# Patient Record
Sex: Female | Born: 1990 | Race: Asian | Hispanic: No | Marital: Married | Smoking: Never smoker
Health system: Southern US, Community
[De-identification: ages and names within clinical notes are randomized; demographics above are authoritative.]

## PROBLEM LIST (undated history)

## (undated) HISTORY — PX: TONSILLECTOMY: SUR1361

---

## 2015-01-09 DIAGNOSIS — L700 Acne vulgaris: Secondary | ICD-10-CM | POA: Insufficient documentation

## 2015-04-04 ENCOUNTER — Ambulatory Visit
Admission: RE | Admit: 2015-04-04 | Discharge: 2015-04-04 | Disposition: A | Discharge status: Home / Self Care | Payer: Medicaid Other | Source: Ambulatory Visit | Attending: Physical Medicine and Rehabilitation | Admitting: Physical Medicine and Rehabilitation

## 2015-04-04 ENCOUNTER — Other Ambulatory Visit: Payer: Self-pay | Admitting: Physical Medicine and Rehabilitation

## 2015-04-04 DIAGNOSIS — M545 Low back pain, unspecified: Secondary | ICD-10-CM

## 2016-04-15 IMAGING — CR DG PELVIS 1-2V
1 series · 1 of 1 positions shown · non-contrast
Comparison: None.

CLINICAL DATA: Chronic bilateral low back pain without sciatica

EXAM:
PELVIS - 1-2 VIEW

[w pelvis upright]
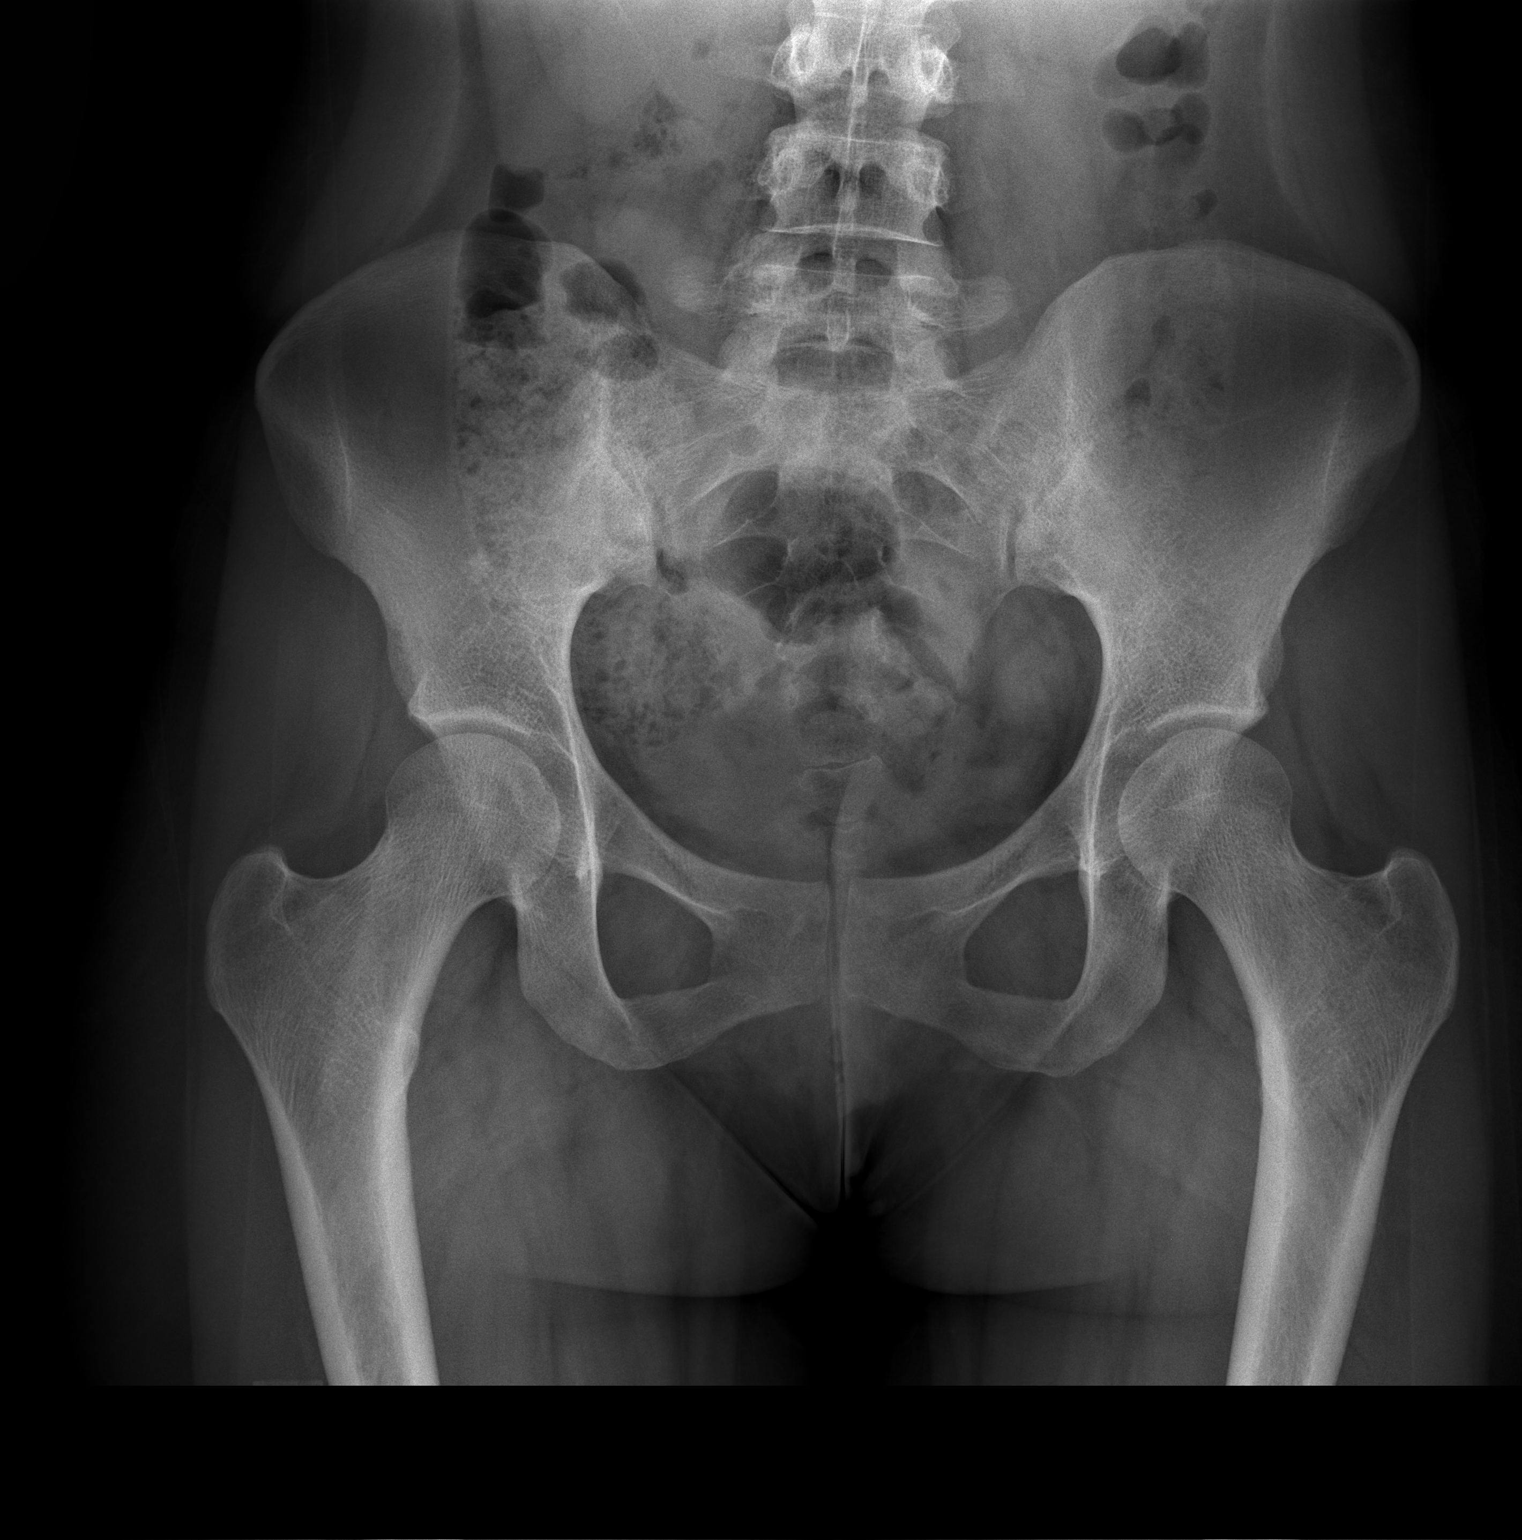

[1 of 1 positions shown; findings below may reference images not displayed]

FINDINGS: There is no evidence of pelvic fracture or diastasis. No pelvic bone
lesions are seen. SI joints are normal.
IMPRESSION: Negative.

## 2017-08-05 NOTE — L&D Delivery Note (Addendum)
Patient is 27 y.o. [id number] [redacted]w[redacted]d admitted for SOL   Delivery Note At 11:47 PM a viable female was delivered via Vaginal, Spontaneous (Presentation: cephalic; ROA ).  APGAR: 9, 9; weight  .   Placenta status: spontaneous, intact.  Cord:  3VC   Anesthesia: none Episiotomy: None Lacerations: 2nd degree;1st degree;Labial Suture Repair: 3.0 vicryl rapide Est. Blood Loss (mL):    Mom to postpartum.  Baby to Couplet care / Skin to Skin.  Upon arrival patient was complete and pushing. She pushed with good maternal effort to deliver a healthy baby boy. Baby delivered without difficulty, was noted to have good tone and place on maternal abdomen for oral suctioning, drying and stimulation, baby demonstrated good ROM of upper extremities bilaterally. Delayed cord clamping performed. Placenta delivered intact with 3V cord. Vaginal canal and perineum was inspected and found to have a second degree perineal laceration and bilateral periurethral lacerations.  The perineal laceration was repaired with 3-0 vicryl rapide while the peri urethral lacerations were hemostatic and not repaired. Pitocin was started and uterus massaged until bleeding slowed. Counts of sharps, instruments, and lap pads were all correct.   Mirian Mo, MD PGY-1 9/26/201912:35 AM  I confirm that I have verified the information documented in the resident's note and that I have also personally reperformed the physical exam and all medical decision making activities.  I was gloved and present for entire delivery SVD without incident No difficulty with shoulders Lacerations as listed above Repair of same supervised by me Aviva Signs, CNM  Please schedule this patient for Postpartum visit in: 4 weeks with the following provider: Any provider For C/S patients schedule nurse incision check in weeks 2 weeks: no Low risk pregnancy complicated by: none Delivery mode:  SVD Anticipated Birth Control:  other/unsure PP Procedures  needed: none  Schedule Integrated BH visit: no

## 2017-10-25 ENCOUNTER — Encounter: Payer: Self-pay | Admitting: Emergency Medicine

## 2017-10-25 ENCOUNTER — Emergency Department
Admission: EM | Admit: 2017-10-25 | Discharge: 2017-10-25 | Disposition: A | Discharge status: Home / Self Care | Payer: BLUE CROSS/BLUE SHIELD | Source: Home / Self Care

## 2017-10-25 ENCOUNTER — Emergency Department (INDEPENDENT_AMBULATORY_CARE_PROVIDER_SITE_OTHER): Payer: BLUE CROSS/BLUE SHIELD

## 2017-10-25 DIAGNOSIS — R0602 Shortness of breath: Secondary | ICD-10-CM

## 2017-10-25 DIAGNOSIS — S29011A Strain of muscle and tendon of front wall of thorax, initial encounter: Secondary | ICD-10-CM

## 2017-10-25 DIAGNOSIS — R05 Cough: Secondary | ICD-10-CM

## 2017-10-25 MED ORDER — PROMETHAZINE-DM 6.25-15 MG/5ML PO SYRP: 5.0000 mL | ORAL_SOLUTION | Freq: Four times a day (QID) | ORAL | 0 refills | Status: DC | PRN

## 2017-10-25 NOTE — ED Triage Notes (Signed)
[facility]/O pain in the chest area, since yesterday after bending over and reaching down to pick up something and then she coughed, she states she felt something tear. Pain is 7/10 and a burning aching type pain. Patient is [redacted] weeks pregnant. No respiratory distress noted.

## 2017-10-26 NOTE — ED Provider Notes (Signed)
[facility]    CSN: [id number] Arrival date & time: 10/25/17  1030     History   Chief Complaint Chief Complaint  Patient presents with  . Nasal Congestion  . Cough  . Pleurisy    HPI Allison Barnes is a 27 y.o. female.   The history is provided by the patient. No language interpreter was used.  Cough  Cough characteristics:  Non-productive Sputum characteristics:  Nondescript Severity:  Severe Onset quality:  Sudden Duration:  1 day Timing:  Constant Progression:  Worsening Chronicity:  New Relieved by:  Nothing Worsened by:  Nothing Ineffective treatments:  None tried Associated symptoms: chest pain   Pt reports pain in her chest since bending over.  Pt reports she has been coughing.  Pt reports she coughed and it felt like something was tearing in her chest.  Pt is [redacted] weeks pregnant  History reviewed. No pertinent past medical history.  There are no active problems to display for this patient.   History reviewed. No pertinent surgical history.  OB History    Gravida  1   Para      Term      Preterm      AB      Living        SAB      TAB      Ectopic      Multiple      Live Births               Home Medications    Prior to Admission medications   Medication Sig Start Date End Date Taking? Authorizing Provider  amoxicillin-clavulanate (AUGMENTIN) 875-125 MG tablet Take 1 tablet by mouth 2 (two) times daily.   Yes [provider]  promethazine-dextromethorphan (PROMETHAZINE-DM) 6.25-15 MG/5ML syrup Take 5 mLs by mouth 4 (four) times daily as needed for cough. 10/25/17   Elson AreasSofia, Javon Snee K, PA-C    Family History History reviewed. No pertinent family history.  Social History Social History   Tobacco Use  . Smoking status: Never Smoker  . Smokeless tobacco: Never Used  Substance Use Topics  . Alcohol use: Never    Frequency: Never  . Drug use: Never     Allergies   Patient has no known  allergies.   Review of Systems Review of Systems  Respiratory: Positive for cough.   Cardiovascular: Positive for chest pain.  All other systems reviewed and are negative.    Physical Exam Triage Vital Signs ED Triage Vitals  Enc Vitals Group     BP 10/25/17 1113 131/84     Pulse Rate 10/25/17 1113 (!) 112     Resp 10/25/17 1113 16     Temp 10/25/17 1113 98.4 F (36.9 C)     Temp Source 10/25/17 1113 Oral     SpO2 10/25/17 1113 99 %     Weight 10/25/17 1116 184 lb (83.5 kg)     Height 10/25/17 1116 5\' 4"  (1.626 m)     Head Circumference --      Peak Flow --      Pain Score 10/25/17 1115 8     Pain Loc --      Pain Edu? --      Excl. in GC? --    No data found.  Updated Vital Signs BP 131/84 (BP Location: Right Arm)   Pulse (!) 112   Temp 98.4 F (36.9 C) (Oral)   Resp 16   Ht 5\' 4"  (  1.626 m)   Wt 184 lb (83.5 kg)   LMP 08/08/2017   SpO2 99%   BMI 31.58 kg/m   Visual Acuity Right Eye Distance:   Left Eye Distance:   Bilateral Distance:    Right Eye Near:   Left Eye Near:    Bilateral Near:     Physical Exam  Constitutional: She appears well-developed and well-nourished. No distress.  HENT:  Head: Normocephalic and atraumatic.  Eyes: Conjunctivae are normal.  Neck: Neck supple.  Cardiovascular: Normal rate and regular rhythm.  No murmur heard. Pulmonary/Chest: Effort normal and breath sounds normal. No respiratory distress.  Abdominal: Soft. There is no tenderness.  Musculoskeletal: She exhibits no edema.  Neurological: She is alert.  Skin: Skin is warm and dry.  Psychiatric: She has a normal mood and affect.  Nursing note and vitals reviewed.    UC Treatments / Results  Labs (all labs ordered are listed, but only abnormal results are displayed) Labs Reviewed - No data to display  EKG None Radiology Dg Chest 2 View  Result Date: 10/25/2017 CLINICAL DATA:  Shortness of breath with productive cough. EXAM: CHEST - 2 VIEW COMPARISON:   02/18/2013 FINDINGS: Both lungs are clear. Heart and mediastinum are within normal limits. Trachea is midline. Bone structures are intact. No large pleural effusions. IMPRESSION: No active cardiopulmonary disease. Electronically Signed   By: Richarda Overlie M.D.   On: 10/25/2017 11:32    Procedures Procedures (including critical care time)  Medications Ordered in UC Medications - No data to display   Initial Impression / Assessment and Plan / UC Course  I have reviewed the triage vital signs and the nursing notes.  Pertinent labs & imaging results that were available during my care of the patient were reviewed by me and considered in my medical decision making (see chart for details).     MDM  Chest xray obtained to make sure pt did not have a pneumothorax.  Chest xray is normal.  I counseled pt.  I suspect viral uri  And possible muscle strain.   Pt given rx for cough medication   Final Clinical Impressions(s) / UC Diagnoses   Final diagnoses:  Muscle strain of chest wall, initial encounter    ED Discharge Orders        Ordered    promethazine-dextromethorphan (PROMETHAZINE-DM) 6.25-15 MG/5ML syrup  4 times daily PRN     10/25/17 1142       Controlled Substance Prescriptions [place] Controlled Substance Registry consulted? Not Applicable   Elson Areas, [place] 10/26/17 1353

## 2017-11-06 ENCOUNTER — Encounter: Payer: Self-pay | Admitting: Obstetrics & Gynecology

## 2017-11-06 ENCOUNTER — Ambulatory Visit (INDEPENDENT_AMBULATORY_CARE_PROVIDER_SITE_OTHER): Payer: BLUE CROSS/BLUE SHIELD | Admitting: Obstetrics & Gynecology

## 2017-11-06 DIAGNOSIS — Z3482 Encounter for supervision of other normal pregnancy, second trimester: Secondary | ICD-10-CM

## 2017-11-06 DIAGNOSIS — Z3480 Encounter for supervision of other normal pregnancy, unspecified trimester: Secondary | ICD-10-CM

## 2017-11-06 DIAGNOSIS — Z113 Encounter for screening for infections with a predominantly sexual mode of transmission: Secondary | ICD-10-CM | POA: Diagnosis not present

## 2017-11-06 DIAGNOSIS — Z124 Encounter for screening for malignant neoplasm of cervix: Secondary | ICD-10-CM

## 2017-11-06 MED ORDER — PRENATAL VITAMINS 0.8 MG PO TABS: 1.0000 | ORAL_TABLET | Freq: Every day | ORAL | 12 refills | Status: DC

## 2017-11-06 NOTE — Progress Notes (Signed)
  Subjective:    Allison Barnes is being seen today for her first obstetrical visit.  This is a planned pregnancy. She is at 783w3d gestation. Her obstetrical history is significant for none. Relationship with FOB: spouse, living together. Patient does intend to breast feed. Pregnancy history fully reviewed.  Patient reports no complaints.  Review of Systems:   Review of Systems  Objective:     BP 118/69   Pulse 90   Wt 183 lb (83 kg)   LMP 08/08/2017 Comment: Patient is 11 weeks pregant  BMI 31.41 kg/m  Physical Exam  Exam    Assessment:    Pregnancy: G3P1001 Patient Active Problem List   Diagnosis Date Noted  . Supervision of other normal pregnancy, antepartum 11/06/2017  . Acne vulgaris 01/09/2015       Plan:     Initial labs drawn. Prenatal vitamins. Problem list reviewed and updated. NIPS drawn Role of ultrasound in pregnancy discussed; fetal survey: ordered. Amniocentesis discussed: not indicated. Come back at 20 weeks, she will do [employer] optimized schedule   Allie BossierMyra C Elpidia Karn 11/06/2017

## 2017-11-07 LAB — OBSTETRIC PANEL
Antibody Screen: NOT DETECTED
BASOS PCT: 0.3 %
Basophils Absolute: 39 cells/uL (ref 0–200)
EOS PCT: 1.5 %
Eosinophils Absolute: 194 cells/uL (ref 15–500)
HEMATOCRIT: 36.9 % (ref 35.0–45.0)
HEMOGLOBIN: 12.2 g/dL (ref 11.7–15.5)
Hepatitis B Surface Ag: NONREACTIVE
Lymphs Abs: 3186 cells/uL (ref 850–3900)
MCH: 27.1 pg (ref 27.0–33.0)
MCHC: 33.1 g/dL (ref 32.0–36.0)
MCV: 82 fL (ref 80.0–100.0)
MONOS PCT: 4.8 %
MPV: 10.6 fL (ref 7.5–12.5)
Neutro Abs: 8862 cells/uL — ABNORMAL HIGH (ref 1500–7800)
Neutrophils Relative %: 68.7 %
Platelets: 338 10*3/uL (ref 140–400)
RBC: 4.5 10*6/uL (ref 3.80–5.10)
RDW: 12.9 % (ref 11.0–15.0)
RPR Ser Ql: NONREACTIVE
Rubella: <0.9 index — ABNORMAL LOW
TOTAL LYMPHOCYTE: 24.7 %
WBC mixed population: 619 cells/uL (ref 200–950)
WBC: 12.9 10*3/uL — AB (ref 3.8–10.8)

## 2017-11-07 LAB — SICKLE CELL SCREEN: Sickle Solubility Test - HGBRFX: NEGATIVE

## 2017-11-07 LAB — HIV ANTIBODY (ROUTINE TESTING W REFLEX): HIV 1&2 Ab, 4th Generation: NONREACTIVE

## 2017-11-08 LAB — CULTURE, OB URINE

## 2017-11-08 LAB — URINE CULTURE, OB REFLEX

## 2017-11-10 LAB — CYTOLOGY - PAP
CHLAMYDIA, DNA PROBE: NEGATIVE
DIAGNOSIS: NEGATIVE
Neisseria Gonorrhea: NEGATIVE

## 2017-11-12 ENCOUNTER — Telehealth: Payer: Self-pay | Admitting: Obstetrics & Gynecology

## 2017-11-12 DIAGNOSIS — Z3480 Encounter for supervision of other normal pregnancy, unspecified trimester: Secondary | ICD-10-CM

## 2017-11-12 NOTE — Telephone Encounter (Signed)
Pt called for labs and test results from 11/06/17. Please call pt [phone].

## 2017-11-12 NOTE — Telephone Encounter (Signed)
Pt called requesting her labwork results.  She is non-immune for [place] and will be offered MMR post delivery.  Her Panorama is pending.  I told pt that it takes about 10 working days to get results.  Will notify her when received.

## 2017-11-14 DIAGNOSIS — Z3480 Encounter for supervision of other normal pregnancy, unspecified trimester: Secondary | ICD-10-CM

## 2017-11-17 ENCOUNTER — Telehealth: Payer: Self-pay | Admitting: *Deleted

## 2017-11-18 ENCOUNTER — Encounter: Payer: Self-pay | Admitting: *Deleted

## 2017-11-18 DIAGNOSIS — Z3480 Encounter for supervision of other normal pregnancy, unspecified trimester: Secondary | ICD-10-CM

## 2017-12-11 ENCOUNTER — Ambulatory Visit (HOSPITAL_COMMUNITY)
Admission: RE | Admit: 2017-12-11 | Discharge: 2017-12-11 | Disposition: A | Discharge status: Home / Self Care | Payer: BLUE CROSS/BLUE SHIELD | Source: Ambulatory Visit | Attending: Obstetrics & Gynecology | Admitting: Obstetrics & Gynecology

## 2017-12-11 ENCOUNTER — Other Ambulatory Visit: Payer: Self-pay | Admitting: Obstetrics & Gynecology

## 2017-12-11 DIAGNOSIS — Z369 Encounter for antenatal screening, unspecified: Secondary | ICD-10-CM | POA: Diagnosis present

## 2017-12-11 DIAGNOSIS — Z3A19 19 weeks gestation of pregnancy: Secondary | ICD-10-CM | POA: Insufficient documentation

## 2017-12-11 DIAGNOSIS — O4442 Low lying placenta NOS or without hemorrhage, second trimester: Secondary | ICD-10-CM | POA: Insufficient documentation

## 2017-12-11 DIAGNOSIS — O4440 Low lying placenta NOS or without hemorrhage, unspecified trimester: Secondary | ICD-10-CM | POA: Diagnosis present

## 2017-12-11 DIAGNOSIS — Z3480 Encounter for supervision of other normal pregnancy, unspecified trimester: Secondary | ICD-10-CM

## 2017-12-18 ENCOUNTER — Ambulatory Visit (INDEPENDENT_AMBULATORY_CARE_PROVIDER_SITE_OTHER): Payer: BLUE CROSS/BLUE SHIELD | Admitting: Obstetrics & Gynecology

## 2017-12-18 VITALS — BP 117/77 | HR 104 | Wt 184.0 lb

## 2017-12-18 DIAGNOSIS — Z09 Encounter for follow-up examination after completed treatment for conditions other than malignant neoplasm: Secondary | ICD-10-CM

## 2017-12-18 DIAGNOSIS — Z3480 Encounter for supervision of other normal pregnancy, unspecified trimester: Secondary | ICD-10-CM

## 2017-12-18 DIAGNOSIS — Z3482 Encounter for supervision of other normal pregnancy, second trimester: Secondary | ICD-10-CM

## 2017-12-18 NOTE — Progress Notes (Signed)
   PRENATAL VISIT NOTE  Subjective:  Allison Barnes is a 27 y.o. G3P1001 at [redacted]w[redacted]d being seen today for ongoing prenatal care.  She is currently monitored for the following issues for this low-risk pregnancy and has Acne vulgaris and Supervision of other normal pregnancy, antepartum on their problem list.  Patient reports no complaints.  Contractions: Not present. Vag. Bleeding: None.  Movement: Present. Denies leaking of fluid.   The following portions of the patient's history were reviewed and updated as appropriate: allergies, current medications, past family history, past medical history, past social history, past surgical history and problem list. Problem list updated.  Objective:   Vitals:   12/18/17 1314  BP: 117/77  Pulse: (!) 104  Weight: 184 lb (83.5 kg)    Fetal Status: Fetal Heart Rate (bpm): 156   Movement: Present     General:  Alert, oriented and cooperative. Patient is in no acute distress.  Skin: Skin is warm and dry. No rash noted.   Cardiovascular: Normal heart rate noted  Respiratory: Normal respiratory effort, no problems with respiration noted  Abdomen: Soft, gravid, appropriate for gestational age.  Pain/Pressure: Absent     Pelvic: Cervical exam deferred        Extremities: Normal range of motion.  Edema: None  Mental Status: Normal mood and affect. Normal behavior. Normal judgment and thought content.   Assessment and Plan:  Pregnancy: G3P1001 at [redacted]w[redacted]d  1. Supervision of other normal pregnancy, antepartum - MSAFP today  2. Follow up  - [place] MFM OB FOLLOW UP; Future  Preterm labor symptoms and general obstetric precautions including but not limited to vaginal bleeding, contractions, leaking of fluid and fetal movement were reviewed in detail with the patient. Please refer to After Visit Summary for other counseling recommendations.  Return in about 8 weeks (around 02/12/2018).  No future appointments.  Allie Bossier, MD

## 2017-12-18 NOTE — Addendum Note (Signed)
Addended by: Kathie Dike on: 12/18/2017 01:49 PM   Modules accepted: Orders

## 2017-12-18 NOTE — Progress Notes (Signed)
Pt c/o nausea.  

## 2017-12-19 LAB — ALPHA FETOPROTEIN, MATERNAL
AFP MOM: 1.02
AFP, Serum: 55.5 ng/mL
Calc'd Gestational Age: 20.4 weeks
MATERNAL WT: 184 [lb_av]
Risk for ONTD: 1
TWINS-AFP: 1

## 2018-01-01 ENCOUNTER — Other Ambulatory Visit (INDEPENDENT_AMBULATORY_CARE_PROVIDER_SITE_OTHER): Payer: BLUE CROSS/BLUE SHIELD

## 2018-01-01 DIAGNOSIS — R309 Painful micturition, unspecified: Secondary | ICD-10-CM

## 2018-01-01 LAB — POCT URINALYSIS DIPSTICK
Bilirubin, UA: NEGATIVE
Blood, UA: NEGATIVE
Glucose, UA: NEGATIVE
Ketones, UA: NEGATIVE
NITRITE UA: NEGATIVE
Protein, UA: NEGATIVE
SPEC GRAV UA: 1.02 (ref 1.010–1.025)
Urobilinogen, UA: NEGATIVE E.U./dL — AB
pH, UA: 7 (ref 5.0–8.0)

## 2018-01-01 MED ORDER — NITROFURANTOIN MONOHYD MACRO 100 MG PO CAPS: 100.0000 mg | ORAL_CAPSULE | Freq: Two times a day (BID) | ORAL | 0 refills | Status: DC

## 2018-01-01 NOTE — Progress Notes (Signed)
Pt here with co/o's burning and frequency with urination since yesterday.  She is [redacted] weeks pregnant but states she is having the urge to go even more frequent.  Her urine dip does show mod leukocytes .  Urine culture sent and a RX for Macrobid sent to [facility] per protocol.  Will notify pt with results.

## 2018-01-03 LAB — CULTURE, OB URINE

## 2018-01-03 LAB — URINE CULTURE, OB REFLEX

## 2018-01-22 ENCOUNTER — Ambulatory Visit (HOSPITAL_COMMUNITY)
Admission: RE | Admit: 2018-01-22 | Discharge: 2018-01-22 | Disposition: A | Discharge status: Home / Self Care | Payer: BLUE CROSS/BLUE SHIELD | Source: Ambulatory Visit | Attending: Obstetrics & Gynecology | Admitting: Obstetrics & Gynecology

## 2018-01-22 ENCOUNTER — Other Ambulatory Visit: Payer: Self-pay | Admitting: Obstetrics & Gynecology

## 2018-01-22 DIAGNOSIS — Z3A25 25 weeks gestation of pregnancy: Secondary | ICD-10-CM | POA: Diagnosis not present

## 2018-01-22 DIAGNOSIS — Z362 Encounter for other antenatal screening follow-up: Secondary | ICD-10-CM | POA: Diagnosis present

## 2018-01-22 DIAGNOSIS — O4442 Low lying placenta NOS or without hemorrhage, second trimester: Secondary | ICD-10-CM

## 2018-01-22 DIAGNOSIS — Z09 Encounter for follow-up examination after completed treatment for conditions other than malignant neoplasm: Secondary | ICD-10-CM

## 2018-02-12 ENCOUNTER — Ambulatory Visit (INDEPENDENT_AMBULATORY_CARE_PROVIDER_SITE_OTHER): Payer: BLUE CROSS/BLUE SHIELD | Admitting: Obstetrics & Gynecology

## 2018-02-12 DIAGNOSIS — Z23 Encounter for immunization: Secondary | ICD-10-CM | POA: Diagnosis not present

## 2018-02-12 DIAGNOSIS — Z3483 Encounter for supervision of other normal pregnancy, third trimester: Secondary | ICD-10-CM

## 2018-02-12 DIAGNOSIS — Z3480 Encounter for supervision of other normal pregnancy, unspecified trimester: Secondary | ICD-10-CM

## 2018-02-12 NOTE — Progress Notes (Signed)
   PRENATAL VISIT NOTE  Subjective:  Allison Barnes is a 27 y.o. married G3P1001 at 598w3d being seen today for ongoing prenatal care.  She is currently monitored for the following issues for this low-risk pregnancy and has Acne vulgaris and Supervision of other normal pregnancy, antepartum on their problem list.  Patient reports varicose veins are painful, wearing compression hose at work.  Contractions: Not present. Vag. Bleeding: None.  Movement: Present. Denies leaking of fluid.   The following portions of the patient's history were reviewed and updated as appropriate: allergies, current medications, past family history, past medical history, past social history, past surgical history and problem list. Problem list updated.  Objective:   Vitals:   02/12/18 1548  BP: 120/71  Pulse: 86  Weight: 194 lb (88 kg)    Fetal Status: Fetal Heart Rate (bpm): 139   Movement: Present     General:  Alert, oriented and cooperative. Patient is in no acute distress.  Skin: Skin is warm and dry. No rash noted.   Cardiovascular: Normal heart rate noted  Respiratory: Normal respiratory effort, no problems with respiration noted  Abdomen: Soft, gravid, appropriate for gestational age.  Pain/Pressure: Absent     Pelvic: Cervical exam deferred        Extremities: Normal range of motion.  Edema: None  Mental Status: Normal mood and affect. Normal behavior. Normal judgment and thought content.   Assessment and Plan:  Pregnancy: G3P1001 at 518w3d  1. Supervision of other normal pregnancy, antepartum   Preterm labor symptoms and general obstetric precautions including but not limited to vaginal bleeding, contractions, leaking of fluid and fetal movement were reviewed in detail with the patient. Please refer to After Visit Summary for other counseling recommendations.  Return in about 1 week (around 02/19/2018) for 2 hour GTT and labs, then 4 weeks ROB.  No future appointments.  Allie BossierMyra C Kenneisha Cochrane, MD

## 2018-02-16 ENCOUNTER — Other Ambulatory Visit: Payer: BLUE CROSS/BLUE SHIELD

## 2018-02-16 DIAGNOSIS — Z3480 Encounter for supervision of other normal pregnancy, unspecified trimester: Secondary | ICD-10-CM

## 2018-02-17 LAB — CBC
HCT: 36.8 % (ref 35.0–45.0)
Hemoglobin: 11.9 g/dL (ref 11.7–15.5)
MCH: 27.9 pg (ref 27.0–33.0)
MCHC: 32.3 g/dL (ref 32.0–36.0)
MCV: 86.2 fL (ref 80.0–100.0)
MPV: 10.1 fL (ref 7.5–12.5)
PLATELETS: 285 10*3/uL (ref 140–400)
RBC: 4.27 10*6/uL (ref 3.80–5.10)
RDW: 12.9 % (ref 11.0–15.0)
WBC: 10.7 10*3/uL (ref 3.8–10.8)

## 2018-02-17 LAB — 2HR GTT W 1 HR, CARPENTER, 75 G
GLUCOSE, 1 HR, GEST: 131 mg/dL (ref 65–179)
GLUCOSE, FASTING, GEST: 87 mg/dL (ref 65–91)
Glucose, 2 Hr, Gest: 149 mg/dL (ref 65–152)

## 2018-02-17 LAB — RPR: RPR: NONREACTIVE

## 2018-02-17 LAB — HIV ANTIBODY (ROUTINE TESTING W REFLEX): HIV 1&2 Ab, 4th Generation: NONREACTIVE

## 2018-03-16 ENCOUNTER — Ambulatory Visit (INDEPENDENT_AMBULATORY_CARE_PROVIDER_SITE_OTHER): Payer: BLUE CROSS/BLUE SHIELD | Admitting: Obstetrics & Gynecology

## 2018-03-16 VITALS — BP 119/74 | HR 96 | Wt 197.0 lb

## 2018-03-16 DIAGNOSIS — Z3480 Encounter for supervision of other normal pregnancy, unspecified trimester: Secondary | ICD-10-CM

## 2018-03-16 DIAGNOSIS — Z3483 Encounter for supervision of other normal pregnancy, third trimester: Secondary | ICD-10-CM

## 2018-03-16 LAB — POCT URINALYSIS DIPSTICK OB
GLUCOSE, UA: NEGATIVE — AB
POC,PROTEIN,UA: NEGATIVE

## 2018-03-16 NOTE — Progress Notes (Signed)
   PRENATAL VISIT NOTE  Subjective:  Allison Barnes is a 27 y.o. G3P1001 at 615w0d being seen today for ongoing prenatal care.  She is currently monitored for the following issues for this low-risk pregnancy and has Acne vulgaris and Supervision of other normal pregnancy, antepartum on their problem list.  Patient reports no complaints.  Contractions: Irritability. Vag. Bleeding: None.  Movement: Present. Denies leaking of fluid.   The following portions of the patient's history were reviewed and updated as appropriate: allergies, current medications, past family history, past medical history, past social history, past surgical history and problem list. Problem list updated.  Objective:   Vitals:   03/16/18 1349  BP: 119/74  Pulse: 96  Weight: 197 lb (89.4 kg)    Fetal Status:     Movement: Present     General:  Alert, oriented and cooperative. Patient is in no acute distress.  Skin: Skin is warm and dry. No rash noted.   Cardiovascular: Normal heart rate noted  Respiratory: Normal respiratory effort, no problems with respiration noted  Abdomen: Soft, gravid, appropriate for gestational age.  Pain/Pressure: Present     Pelvic: Cervical exam deferred        Extremities: Normal range of motion.  Edema: Trace  Mental Status: Normal mood and affect. Normal behavior. Normal judgment and thought content.   Assessment and Plan:  Pregnancy: G3P1001 at 395w0d  1. Supervision of other normal pregnancy, antepartum  - POC Urinalysis Dipstick OB  Preterm labor symptoms and general obstetric precautions including but not limited to vaginal bleeding, contractions, leaking of fluid and fetal movement were reviewed in detail with the patient. Please refer to After Visit Summary for other counseling recommendations.  No follow-ups on file.  Future Appointments  Date Time Provider Department Center  04/03/2018  9:10 AM Katrinka BlazingSmith, [place] [facility]    Allie BossierMyra C Cheyene Hamric, MD

## 2018-04-03 ENCOUNTER — Encounter: Payer: Self-pay | Admitting: Advanced Practice Midwife

## 2018-04-03 ENCOUNTER — Ambulatory Visit (INDEPENDENT_AMBULATORY_CARE_PROVIDER_SITE_OTHER): Payer: BLUE CROSS/BLUE SHIELD | Admitting: Advanced Practice Midwife

## 2018-04-03 VITALS — BP 114/79 | HR 91 | Wt 195.0 lb

## 2018-04-03 DIAGNOSIS — O26893 Other specified pregnancy related conditions, third trimester: Secondary | ICD-10-CM

## 2018-04-03 DIAGNOSIS — N898 Other specified noninflammatory disorders of vagina: Secondary | ICD-10-CM

## 2018-04-03 DIAGNOSIS — K219 Gastro-esophageal reflux disease without esophagitis: Secondary | ICD-10-CM

## 2018-04-03 DIAGNOSIS — Z3483 Encounter for supervision of other normal pregnancy, third trimester: Secondary | ICD-10-CM

## 2018-04-03 DIAGNOSIS — O99613 Diseases of the digestive system complicating pregnancy, third trimester: Secondary | ICD-10-CM

## 2018-04-03 DIAGNOSIS — Z3480 Encounter for supervision of other normal pregnancy, unspecified trimester: Secondary | ICD-10-CM

## 2018-04-03 LAB — POCT URINALYSIS DIPSTICK OB
Glucose, UA: NEGATIVE
POC,PROTEIN,UA: NEGATIVE

## 2018-04-03 MED ORDER — PANTOPRAZOLE SODIUM 40 MG PO TBEC: 40.0000 mg | DELAYED_RELEASE_TABLET | Freq: Every day | ORAL | 2 refills | Status: DC

## 2018-04-03 NOTE — Addendum Note (Signed)
Addended by: Dorathy KinsmanSMITH, Bonni Neuser on: 04/03/2018 09:57 AM   Modules accepted: Orders

## 2018-04-03 NOTE — Progress Notes (Addendum)
   PRENATAL VISIT NOTE  Subjective:  Allison Barnes is a 27 y.o. G3P1001 at 6964w4d being seen today for ongoing prenatal care.  She is currently monitored for the following issues for this low-risk pregnancy and has Acne vulgaris and Supervision of other normal pregnancy, antepartum on their problem list.  Patient reports severe heartburn not controlled w/ Zantax 150 BID on schedule plus tums,  occasional contractions, increase malodorous white discharge.  Contractions: Irritability. Vag. Bleeding: None.  Movement: Present. Denies leaking of fluid.   The following portions of the patient's history were reviewed and updated as appropriate: allergies, current medications, past family history, past medical history, past social history, past surgical history and problem list. Problem list updated.  Objective:   Vitals:   04/03/18 0858  BP: 114/79  Pulse: 91  Weight: 195 lb (88.5 kg)    Fetal Status: Fetal Heart Rate (bpm): 146 Fundal Height: 36 cm Movement: Present  Presentation: Vertex  General:  Alert, oriented and cooperative. Patient is in no acute distress.  Skin: Skin is warm and dry. No rash noted.   Cardiovascular: Normal heart rate noted  Respiratory: Normal respiratory effort, no problems with respiration noted  Abdomen: Soft, gravid, appropriate for gestational age.  Pain/Pressure: Present     Pelvic: Cervical exam deferred Dilation: 1 Effacement (%): 70 Station: -3. Physiologic discharge. No LOF.   Extremities: Normal range of motion.  Edema: Trace  Mental Status: Normal mood and affect. Normal behavior. Normal judgment and thought content.   Assessment and Plan:  Pregnancy: G3P1001 at 6064w4d  1. Encounter for supervision of other normal pregnancy in third trimester  - POC Urinalysis Dipstick OB  2. Supervision of other normal pregnancy, antepartum - GBS at [place]  3. Reflux - Rx Protonix  Preterm labor symptoms and general obstetric precautions including but not limited to  vaginal bleeding, contractions, leaking of fluid and fetal movement were reviewed in detail with the patient. Please refer to After Visit Summary for other counseling recommendations.  Return in about 2 weeks (around 04/17/2018) for ROB/GBS.  No future appointments.  Dorathy K[place] Ailana Cuadrado, CNM

## 2018-04-03 NOTE — Patient Instructions (Addendum)
[email or web address] Deciding about Circumcision in Baby Boys  (The Basics)  What is circumcision?  Circumcision is a surgery that removes the skin that covers the tip of the penis, called the "foreskin" Circumcision is usually done when a boy is between 881 and 3110 days old. In the [place], circumcision is common. In some other countries, fewer boys are circumcised. Circumcision is a common tradition in some religions.  Should I have my baby boy circumcised?  There is no easy answer. Circumcision has some benefits. But it also has risks. After talking with your doctor, you will have to decide for yourself what is right for your family.  What are the benefits of circumcision?  Circumcised boys seem to have slightly lower rates of: ?Urinary tract infections ?Swelling of the opening at the tip of the penis Circumcised men seem to have slightly lower rates of: ?Urinary tract infections ?Swelling of the opening at the tip of the penis ?Penis cancer ?HIV and other infections that you catch during sex ?Cervical cancer in the women they have sex with Even so, in the [place], the risks of these problems are small - even in boys and men who have not been circumcised. Plus, boys and men who are not circumcised can reduce these extra risks by: ?Cleaning their penis well ?Using condoms during sex  What are the risks of circumcision?  Risks include: ?Bleeding or infection from the surgery ?Damage to or amputation of the penis ?A chance that the doctor will cut off too much or not enough of the foreskin ?A chance that sex won't feel as good later in life Only about 1 out of every 200 circumcisions leads to problems. There is also a chance that your health insurance won't pay for circumcision.  How is circumcision done in baby boys?  First, the baby gets medicine for pain relief. This might be a cream on the skin or a shot into the base of the penis. Next, the doctor cleans the  baby's penis well. Then he or she uses special tools to cut off the foreskin. Finally, the doctor wraps a bandage (called gauze) around the baby's penis. If you have your baby circumcised, his doctor or nurse will give you instructions on how to care for him after the surgery. It is important that you follow those instructions carefully.  Places to have your son circumcised:    [facility] [phone] $480 while you are in hospital  [facility] [phone] $244 by 4 wks  [facility] [phone] $175 by 2 wks  [facility] [phone] $250 by 7 days [facility] [phone] $269 by 4 wks  These prices sometimes change but are roughly what you can expect to pay. Please call and confirm pricing.   Circumcision is considered an elective/non-medically necessary procedure. There are many reasons parents decide to have their sons circumsized. During the first year of life circumcised males have a reduced risk of urinary tract infections but after this year the rates between circumcised males and uncircumcised males are the same.  It is safe to have your son circumcised outside of the hospital and the places above perform them regularly.    Contraception Choices Contraception, also called birth control, refers to methods or devices that prevent pregnancy. Hormonal methods Contraceptive implant A contraceptive implant is a thin, plastic tube that contains a hormone. It is inserted into the upper part of the arm. It can remain in place for up to 3 years. Progestin-only injections Progestin-only injections are injections of  progestin, a synthetic form of the hormone progesterone. They are given every 3 months by a health care provider. Birth control pills Birth control pills are pills that contain hormones that prevent pregnancy. They must be taken  once a day, preferably at the same time each day. Birth control patch The birth control patch contains hormones that prevent pregnancy. It is placed on the skin and must be changed once a week for three weeks and removed on the fourth week. A prescription is needed to use this method of contraception. Vaginal ring A vaginal ring contains hormones that prevent pregnancy. It is placed in the vagina for three weeks and removed on the fourth week. After that, the process is repeated with a new ring. A prescription is needed to use this method of contraception. Emergency contraceptive Emergency contraceptives prevent pregnancy after unprotected sex. They come in pill form and can be taken up to 5 days after sex. They work best the sooner they are taken after having sex. Most emergency contraceptives are available without a prescription. This method should not be used as your only form of birth control. Barrier methods Female condom A female condom is a thin sheath that is worn over the penis during sex. Condoms keep sperm from going inside a woman's body. They can be used with a spermicide to increase their effectiveness. They should be disposed after a single use. Female condom A female condom is a soft, loose-fitting sheath that is put into the vagina before sex. The condom keeps sperm from going inside a woman's body. They should be disposed after a single use. Diaphragm A diaphragm is a soft, dome-shaped barrier. It is inserted into the vagina before sex, along with a spermicide. The diaphragm blocks sperm from entering the uterus, and the spermicide kills sperm. A diaphragm should be left in the vagina for 6-8 hours after sex and removed within 24 hours. A diaphragm is prescribed and fitted by a health care provider. A diaphragm should be replaced every 1-2 years, after giving birth, after gaining more than 15 lb (6.8 kg), and after pelvic surgery. Cervical cap A cervical cap is a round, soft latex or  plastic cup that fits over the cervix. It is inserted into the vagina before sex, along with spermicide. It blocks sperm from entering the uterus. The cap should be left in place for 6-8 hours after sex and removed within 48 hours. A cervical cap must be prescribed and fitted by a health care provider. It should be replaced every 2 years. Sponge A sponge is a soft, circular piece of polyurethane foam with spermicide on it. The sponge helps block sperm from entering the uterus, and the spermicide kills sperm. To use it, you make it wet and then insert it into the vagina. It should be inserted before sex, left in for at least 6 hours after sex, and removed and thrown away within 30 hours. Spermicides Spermicides are chemicals that kill or block sperm from entering the cervix and uterus. They can come as a cream, jelly, suppository, foam, or tablet. A spermicide should be inserted into the vagina with an applicator at least 10-15 minutes before sex to allow time for it to work. The process must be repeated every time you have sex. Spermicides do not require a prescription. Intrauterine contraception Intrauterine device (IUD) An IUD is a T-shaped device that is put in a woman's uterus. There are two types:  Hormone IUD.This type contains progestin, a synthetic form of the  hormone progesterone. This type can stay in place for 3-5 years.  Copper IUD.This type is wrapped in copper wire. It can stay in place for 10 years.  Permanent methods of contraception Female tubal ligation In this method, a woman's fallopian tubes are sealed, tied, or blocked during surgery to prevent eggs from traveling to the uterus. Hysteroscopic sterilization In this method, a small, flexible insert is placed into each fallopian tube. The inserts cause scar tissue to form in the fallopian tubes and block them, so sperm cannot reach an egg. The procedure takes about 3 months to be effective. Another form of birth control must be  used during those 3 months. Female sterilization This is a procedure to tie off the tubes that carry sperm (vasectomy). After the procedure, the man can still ejaculate fluid (semen). Natural planning methods Natural family planning In this method, a couple does not have sex on days when the woman could become pregnant. Calendar method This means keeping track of the length of each menstrual cycle, identifying the days when pregnancy can happen, and not having sex on those days. Ovulation method In this method, a couple avoids sex during ovulation. Symptothermal method This method involves not having sex during ovulation. The woman typically checks for ovulation by watching changes in her temperature and in the consistency of cervical mucus. Post-ovulation method In this method, a couple waits to have sex until after ovulation. Summary  Contraception, also called birth control, means methods or devices that prevent pregnancy.  Hormonal methods of contraception include implants, injections, pills, patches, vaginal rings, and emergency contraceptives.  Barrier methods of contraception can include female condoms, female condoms, diaphragms, cervical caps, sponges, and spermicides.  There are two types of IUDs (intrauterine devices). An IUD can be put in a woman's uterus to prevent pregnancy for 3-5 years.  Permanent sterilization can be done through a procedure for males, females, or both.  Natural family planning methods involve not having sex on days when the woman could become pregnant. This information is not intended to replace advice given to you by your health care provider. Make sure you discuss any questions you have with your health care provider. Document Released: 07/22/2005 Document Revised: 08/24/2016 Document Reviewed: 08/24/2016 Elsevier Interactive Patient Education  2018 [employer].   [employer] of the uterus can occur throughout pregnancy,  but they are not always a sign that you are in labor. You may have practice contractions called Braxton Hicks contractions. These false labor contractions are sometimes confused with true labor. What are Deberah Pelton contractions? Braxton Hicks contractions are tightening movements that occur in the muscles of the uterus before labor. Unlike true labor contractions, these contractions do not result in opening (dilation) and thinning of the cervix. Toward the end of pregnancy (32-34 weeks), Braxton Hicks contractions can happen more often and may become stronger. These contractions are sometimes difficult to tell apart from true labor because they can be very uncomfortable. You should not feel embarrassed if you go to the hospital with false labor. Sometimes, the only way to tell if you are in true labor is for your health care provider to look for changes in the cervix. The health care provider will do a physical exam and may monitor your contractions. If you are not in true labor, the exam should show that your cervix is not dilating and your water has not broken. If there are other health problems associated with your pregnancy, it is completely safe for  you to be sent home with false labor. You may continue to have Braxton Hicks contractions until you go into true labor. How to tell the difference between true labor and false labor True labor  Contractions last 30-70 seconds.  Contractions become very regular.  Discomfort is usually felt in the top of the uterus, and it spreads to the lower abdomen and low back.  Contractions do not go away with walking.  Contractions usually become more intense and increase in frequency.  The cervix dilates and gets thinner. False labor  Contractions are usually shorter and not as strong as true labor contractions.  Contractions are usually irregular.  Contractions are often felt in the front of the lower abdomen and in the groin.  Contractions may go  away when you walk around or change positions while lying down.  Contractions get weaker and are shorter-lasting as time goes on.  The cervix usually does not dilate or become thin. Follow these instructions at home:  Take over-the-counter and prescription medicines only as told by your health care provider.  Keep up with your usual exercises and follow other instructions from your health care provider.  Eat and drink lightly if you think you are going into labor.  If Braxton Hicks contractions are making you uncomfortable: ? Change your position from lying down or resting to walking, or change from walking to resting. ? Sit and rest in a tub of warm water. ? Drink enough fluid to keep your urine pale yellow. Dehydration may cause these contractions. ? Do slow and deep breathing several times an hour.  Keep all follow-up prenatal visits as told by your health care provider. This is important. Contact a health care provider if:  You have a fever.  You have continuous pain in your abdomen. Get help right away if:  Your contractions become stronger, more regular, and closer together.  You have fluid leaking or gushing from your vagina.  You pass blood-tinged mucus (bloody show).  You have bleeding from your vagina.  You have low back pain that you never had before.  You feel your baby's head pushing down and causing pelvic pressure.  Your baby is not moving inside you as much as it used to. Summary  Contractions that occur before labor are called Braxton Hicks contractions, false labor, or practice contractions.  Braxton Hicks contractions are usually shorter, weaker, farther apart, and less regular than true labor contractions. True labor contractions usually become progressively stronger and regular and they become more frequent.  Manage discomfort from [facility] contractions by changing position, resting in a warm bath, drinking plenty of water, or practicing deep  breathing. This information is not intended to replace advice given to you by your health care provider. Make sure you discuss any questions you have with your health care provider. Document Released: 12/05/2016 Document Revised: 12/05/2016 Document Reviewed: 12/05/2016 Elsevier Interactive Patient Education  2018 [employer].

## 2018-04-06 ENCOUNTER — Encounter: Payer: Self-pay | Admitting: Emergency Medicine

## 2018-04-06 ENCOUNTER — Emergency Department
Admission: EM | Admit: 2018-04-06 | Discharge: 2018-04-06 | Disposition: A | Discharge status: Home / Self Care | Payer: BLUE CROSS/BLUE SHIELD | Source: Home / Self Care | Attending: Family Medicine | Admitting: Family Medicine

## 2018-04-06 ENCOUNTER — Other Ambulatory Visit: Payer: Self-pay

## 2018-04-06 DIAGNOSIS — O2343 Unspecified infection of urinary tract in pregnancy, third trimester: Secondary | ICD-10-CM

## 2018-04-06 LAB — POCT URINALYSIS DIP (MANUAL ENTRY)
Bilirubin, UA: NEGATIVE
Blood, UA: NEGATIVE
Glucose, UA: NEGATIVE mg/dL
Ketones, POC UA: NEGATIVE mg/dL
Nitrite, UA: NEGATIVE
Spec Grav, UA: 1.02 (ref 1.010–1.025)
Urobilinogen, UA: 0.2 E.U./dL
pH, UA: 6.5 (ref 5.0–8.0)

## 2018-04-06 MED ORDER — CEPHALEXIN 500 MG PO CAPS: 500.0000 mg | ORAL_CAPSULE | Freq: Two times a day (BID) | ORAL | 0 refills | Status: DC

## 2018-04-06 NOTE — ED Provider Notes (Signed)
[facility]    CSN: [id number] Arrival date & time: 04/06/18  0951     History   Chief Complaint Chief Complaint  Patient presents with  . Dysuria    HPI Yanisa Tewell is a 27 y.o. female.   HPI Afnan Lebowitz is a 27 y.o. female presenting to UC c/o 4 days of dysuria.  She is [redacted] weeks pregnant and has been having worsening Deberah Pelton since this weekend. She already has a daughter and knows the difference between Deberah Pelton and regular labor contracts. She is not concerned about the contractions right now but notes the contractions are worse with urination.  She was seen by her PCP on Friday, 04/03/18 but was not having dysuria at that time and her urine was clear.  Denies fever, chills, n/v/d.    History reviewed. No pertinent past medical history.  Patient Active Problem List   Diagnosis Date Noted  . Supervision of other normal pregnancy, antepartum 11/06/2017  . Acne vulgaris 01/09/2015    Past Surgical History:  Procedure Laterality Date  . TONSILLECTOMY      OB History    Gravida  3   Para  1   Term  1   Preterm      AB      Living  1     SAB      TAB      Ectopic      Multiple      Live Births               Home Medications    Prior to Admission medications   Medication Sig Start Date End Date Taking? Authorizing Provider  acetaminophen (TYLENOL) 500 MG tablet Take by mouth. 12/23/13   [provider]  cephALEXin (KEFLEX) 500 MG capsule Take 1 capsule (500 mg total) by mouth 2 (two) times daily. 04/06/18   Lurene Shadow, PA-C  pantoprazole (PROTONIX) 40 MG tablet Take 1 tablet (40 mg total) by mouth daily. 04/03/18   Katrinka Blazing, [place], CNM  Prenatal Vit-Fe Fumarate-FA (PRENATAL VITAMIN PO) Take by mouth.    [provider]    Family History Family History  Problem Relation Age of Onset  . Diabetes Mother   . Diabetes Sister   . Cancer Sister        thyroid    Social History Social History   Tobacco  Use  . Smoking status: Never Smoker  . Smokeless tobacco: Never Used  Substance Use Topics  . Alcohol use: Never    Frequency: Never  . Drug use: Never     Allergies   Patient has no known allergies.   Review of Systems Review of Systems  Constitutional: Negative for chills and fever.  Cardiovascular: Negative for chest pain and palpitations.  Gastrointestinal: Positive for abdominal pain. Negative for diarrhea, nausea and vomiting.  Genitourinary: Positive for dysuria, frequency and urgency. Negative for hematuria and pelvic pain.  Musculoskeletal: Negative for arthralgias, back pain and myalgias.  Skin: Negative for rash.     Physical Exam Triage Vital Signs ED Triage Vitals  Enc Vitals Group     BP 04/06/18 1041 116/79     Pulse Rate 04/06/18 1041 74     Resp --      Temp 04/06/18 1041 97.9 F (36.6 C)     Temp Source 04/06/18 1041 Oral     SpO2 04/06/18 1041 100 %     Weight 04/06/18 1042 196 lb (88.9 kg)  Height 04/06/18 1042 5\' 4"  (1.626 m)     Head Circumference --      Peak Flow --      Pain Score 04/06/18 1041 3     Pain Loc --      Pain Edu? --      Excl. in GC? --    No data found.  Updated Vital Signs BP 116/79 (BP Location: Right Arm)   Pulse 74   Temp 97.9 F (36.6 C) (Oral)   Ht 5\' 4"  (1.626 m)   Wt 196 lb (88.9 kg)   LMP 08/08/2017 Comment: Patient is 11 weeks pregant  SpO2 100%   BMI 33.64 kg/m   Visual Acuity Right Eye Distance:   Left Eye Distance:   Bilateral Distance:    Right Eye Near:   Left Eye Near:    Bilateral Near:     Physical Exam  Constitutional: She is oriented to person, place, and time. She appears well-developed and well-nourished. No distress.  HENT:  Head: Normocephalic and atraumatic.  Mouth/Throat: Oropharynx is clear and moist.  Eyes: EOM are normal.  Neck: Normal range of motion.  Cardiovascular: Normal rate and regular rhythm.  Pulmonary/Chest: Effort normal and breath sounds normal. No stridor.  No respiratory distress. She has no wheezes. She has no rales.  Abdominal: Soft. There is no tenderness.  Pregnant abdomen, non-tender.  Musculoskeletal: Normal range of motion.  Neurological: She is alert and oriented to person, place, and time.  Skin: Skin is warm and dry. She is not diaphoretic.  Psychiatric: She has a normal mood and affect. Her behavior is normal.  Nursing note and vitals reviewed.    UC Treatments / Results  Labs (all labs ordered are listed, but only abnormal results are displayed) Labs Reviewed  POCT URINALYSIS DIP (MANUAL ENTRY) - Abnormal; Notable for the following components:      Result Value   Protein Ur, POC trace (*)    Leukocytes, UA Moderate (2+) (*)    All other components within normal limits  URINE CULTURE    EKG None  Radiology No results found.  Procedures Procedures (including critical care time)  Medications Ordered in UC Medications - No data to display  Initial Impression / Assessment and Plan / UC Course  I have reviewed the triage vital signs and the nursing notes.  Pertinent labs & imaging results that were available during my care of the patient were reviewed by me and considered in my medical decision making (see chart for details).     UA c/w UTI Urine culture sent Encouraged f/u with OB/GYN this week  Final Clinical Impressions(s) / UC Diagnoses   Final diagnoses:  Urinary tract infection in mother during third trimester of pregnancy     Discharge Instructions      Please take your antibiotic as prescribed. A urine culture has been sent to check the severity of your urinary infection and to determine if you are on the most appropriate antibiotic. The results should come back within 2-3 days and you will be notified even if no medication change is needed.  Please stay well hydrated and follow up with your family doctor in 1 week if not improving, sooner if worsening.  Please follow up with Women's health  later this week for recheck of symptoms to ensure infection is clearing.  Please call 911 or go to [facility] if Braxton-Hicks contractions are worsening, you develop severe pain, vaginal bleeding, fever, unable to keep down fluids,  or other new concerning symptoms develop.     ED Prescriptions    Medication Sig Dispense Auth. Provider   cephALEXin (KEFLEX) 500 MG capsule Take 1 capsule (500 mg total) by mouth 2 (two) times daily. 14 capsule Lurene Shadow, PA-C     Controlled Substance Prescriptions [place] Controlled Substance Registry consulted? Not Applicable   Rolla Plate 04/06/18 1727

## 2018-04-06 NOTE — ED Triage Notes (Signed)
Dysuria x 4days, [redacted] weeks pregnant, Braxton Hicks contractions have increased over the weekend with urniation

## 2018-04-06 NOTE — Discharge Instructions (Signed)
°  Please take your antibiotic as prescribed. A urine culture has been sent to check the severity of your urinary infection and to determine if you are on the most appropriate antibiotic. The results should come back within 2-3 days and you will be notified even if no medication change is needed.  Please stay well hydrated and follow up with your family doctor in 1 week if not improving, sooner if worsening.  Please follow up with Women's health later this week for recheck of symptoms to ensure infection is clearing.  Please call 911 or go to [facility] if Braxton-Hicks contractions are worsening, you develop severe pain, vaginal bleeding, fever, unable to keep down fluids, or other new concerning symptoms develop.

## 2018-04-07 ENCOUNTER — Telehealth: Payer: Self-pay

## 2018-04-07 LAB — URINE CULTURE
MICRO NUMBER:: 91048255
SPECIMEN QUALITY:: ADEQUATE

## 2018-04-07 LAB — CERVICOVAGINAL ANCILLARY ONLY
Bacterial vaginitis: POSITIVE — AB
Candida vaginitis: NEGATIVE

## 2018-04-07 NOTE — Telephone Encounter (Signed)
There was a paper on our fax machine from after hours nurse line that pt called complaining about dysuria and contractions.  Pt was seen at urgent care and they recommended she be seen here this week. I called pt and offered her an appt today since we had a cancellation or we can put her somewhere else this week. Asked pt to call the office back.

## 2018-04-08 ENCOUNTER — Telehealth: Payer: Self-pay

## 2018-04-08 DIAGNOSIS — N760 Acute vaginitis: Principal | ICD-10-CM

## 2018-04-08 DIAGNOSIS — B9689 Other specified bacterial agents as the cause of diseases classified elsewhere: Secondary | ICD-10-CM

## 2018-04-08 MED ORDER — METRONIDAZOLE 500 MG PO TABS: 500.0000 mg | ORAL_TABLET | Freq: Two times a day (BID) | ORAL | 0 refills | Status: DC

## 2018-04-08 NOTE — Telephone Encounter (Signed)
PT called and left message wanting to know the results of her Aptima swab. I left a message on her voicemail letting her know it was positive for BV and that I was sending Rx to pharmacy on chart.

## 2018-04-08 NOTE — Telephone Encounter (Signed)
Spoke with patient, is feeling better.  Will follow up with OB and as needed.

## 2018-04-17 ENCOUNTER — Ambulatory Visit (INDEPENDENT_AMBULATORY_CARE_PROVIDER_SITE_OTHER): Payer: BLUE CROSS/BLUE SHIELD

## 2018-04-17 VITALS — BP 134/80 | HR 86 | Wt 200.0 lb

## 2018-04-17 DIAGNOSIS — Z3483 Encounter for supervision of other normal pregnancy, third trimester: Secondary | ICD-10-CM

## 2018-04-17 DIAGNOSIS — Z113 Encounter for screening for infections with a predominantly sexual mode of transmission: Secondary | ICD-10-CM | POA: Diagnosis not present

## 2018-04-17 DIAGNOSIS — O2203 Varicose veins of lower extremity in pregnancy, third trimester: Secondary | ICD-10-CM

## 2018-04-17 LAB — OB RESULTS CONSOLE GBS: STREP GROUP B AG: NEGATIVE

## 2018-04-17 LAB — OB RESULTS CONSOLE GC/CHLAMYDIA: Gonorrhea: NEGATIVE

## 2018-04-17 NOTE — Patient Instructions (Signed)

## 2018-04-17 NOTE — Progress Notes (Signed)
   PRENATAL VISIT NOTE  Subjective:  Allison Barnes is a 27 y.o. G3P1001 at 7231w4d being seen today for ongoing prenatal care.  She is currently monitored for the following issues for this low-risk pregnancy and has Acne vulgaris and Supervision of other normal pregnancy, antepartum on their problem list.  Patient reports worsening varicose veins.  Contractions: Irritability. Vag. Bleeding: None.  Movement: Present. Denies leaking of fluid.   The following portions of the patient's history were reviewed and updated as appropriate: allergies, current medications, past family history, past medical history, past social history, past surgical history and problem list. Problem list updated.  Objective:   Vitals:   04/17/18 0823  BP: 134/80  Pulse: 86  Weight: 200 lb (90.7 kg)    Fetal Status: Fetal Heart Rate (bpm): 128 Fundal Height: 38 cm Movement: Present     General:  Alert, oriented and cooperative. Patient is in no acute distress.  Skin: Skin is warm and dry. No rash noted.   Cardiovascular: Normal heart rate noted  Respiratory: Normal respiratory effort, no problems with respiration noted  Abdomen: Soft, gravid, appropriate for gestational age.  Pain/Pressure: Present     Pelvic: Cervical exam performed Dilation: 1 Effacement (%): 70 Station: -2  Extremities: Normal range of motion.  Edema: Trace  Mental Status: Normal mood and affect. Normal behavior. Normal judgment and thought content.   Assessment and Plan:  Pregnancy: G3P1001 at 5631w4d  1. Encounter for supervision of other normal pregnancy in third trimester - GBS and cultures today - Labor precautions reviewed  2. 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 - Worsens at work and patient has started maternity leave now - Comfort measures reviewed for home.  Term labor symptoms and general  obstetric precautions including but not limited to vaginal bleeding, contractions, leaking of fluid and fetal movement were reviewed in detail with the patient. Please refer to After Visit Summary for other counseling recommendations.  Return in about 1 week (around 04/24/2018) for Return OB visit.  Rolm BookbinderCaroline M Neill, CNM 04/17/18 8:49 AM

## 2018-04-20 LAB — GC/CHLAMYDIA PROBE AMP (~~LOC~~) NOT AT ARMC
Chlamydia: NEGATIVE
NEISSERIA GONORRHEA: NEGATIVE

## 2018-04-20 LAB — CULTURE, BETA STREP (GROUP B ONLY)
MICRO NUMBER:: 91100229
SPECIMEN QUALITY:: ADEQUATE

## 2018-04-24 ENCOUNTER — Ambulatory Visit (INDEPENDENT_AMBULATORY_CARE_PROVIDER_SITE_OTHER): Payer: BLUE CROSS/BLUE SHIELD

## 2018-04-24 VITALS — BP 125/73 | HR 67 | Wt 197.0 lb

## 2018-04-24 DIAGNOSIS — Z3483 Encounter for supervision of other normal pregnancy, third trimester: Secondary | ICD-10-CM

## 2018-04-24 NOTE — Progress Notes (Signed)
3/9

## 2018-04-24 NOTE — Progress Notes (Signed)
   PRENATAL VISIT NOTE  Subjective:  Allison Barnes is a 27 y.o. G3P1001 at 4833w4d being seen today for ongoing prenatal care.  She is currently monitored for the following issues for this low-risk pregnancy and has Acne vulgaris and Supervision of other normal pregnancy, antepartum on their problem list.  Patient reports occasional braxton hicks.  Contractions: Irritability. Vag. Bleeding: None.  Movement: Present. Denies leaking of fluid.   The following portions of the patient's history were reviewed and updated as appropriate: allergies, current medications, past family history, past medical history, past social history, past surgical history and problem list. Problem list updated.  Objective:   Vitals:   04/24/18 0924  BP: 125/73  Pulse: 67  Weight: 197 lb (89.4 kg)    Fetal Status: Fetal Heart Rate (bpm): 135 Fundal Height: 39 cm Movement: Present     General:  Alert, oriented and cooperative. Patient is in no acute distress.  Skin: Skin is warm and dry. No rash noted.   Cardiovascular: Normal heart rate noted  Respiratory: Normal respiratory effort, no problems with respiration noted  Abdomen: Soft, gravid, appropriate for gestational age.  Pain/Pressure: Present     Pelvic: Cervical exam performed        Extremities: Normal range of motion.  Edema: Trace  Mental Status: Normal mood and affect. Normal behavior. Normal judgment and thought content.   Assessment and Plan:  Pregnancy: G3P1001 at 4033w4d  1. Encounter for supervision of other normal pregnancy in third trimester - No complaints. Routine care Discussed IOL at 41 weeks if no labor before then.  Term labor symptoms and general obstetric precautions including but not limited to vaginal bleeding, contractions, leaking of fluid and fetal movement were reviewed in detail with the patient. Please refer to After Visit Summary for other counseling recommendations.  Return in about 1 week (around 05/01/2018) for Return OB  visit.  Rolm BookbinderCaroline M Karas Pickerill, CNM 04/24/18 9:37 AM

## 2018-04-28 ENCOUNTER — Other Ambulatory Visit: Payer: Self-pay | Admitting: Advanced Practice Midwife

## 2018-04-28 DIAGNOSIS — K219 Gastro-esophageal reflux disease without esophagitis: Secondary | ICD-10-CM

## 2018-04-28 DIAGNOSIS — O99613 Diseases of the digestive system complicating pregnancy, third trimester: Principal | ICD-10-CM

## 2018-04-29 ENCOUNTER — Inpatient Hospital Stay (HOSPITAL_COMMUNITY)
Admission: AD | Admit: 2018-04-29 | Discharge: 2018-05-01 | DRG: 807 | Disposition: A | Discharge status: Home / Self Care | Payer: BLUE CROSS/BLUE SHIELD | Attending: Obstetrics and Gynecology | Admitting: Obstetrics and Gynecology

## 2018-04-29 ENCOUNTER — Encounter (HOSPITAL_COMMUNITY): Payer: Self-pay

## 2018-04-29 ENCOUNTER — Telehealth: Payer: Self-pay

## 2018-04-29 DIAGNOSIS — Z3A39 39 weeks gestation of pregnancy: Secondary | ICD-10-CM

## 2018-04-29 DIAGNOSIS — O321XX0 Maternal care for breech presentation, not applicable or unspecified: Principal | ICD-10-CM | POA: Diagnosis present

## 2018-04-29 DIAGNOSIS — O479 False labor, unspecified: Secondary | ICD-10-CM | POA: Diagnosis present

## 2018-04-29 DIAGNOSIS — Z3483 Encounter for supervision of other normal pregnancy, third trimester: Secondary | ICD-10-CM | POA: Diagnosis present

## 2018-04-29 LAB — CBC
HEMATOCRIT: 40.5 % (ref 36.0–46.0)
Hemoglobin: 13.5 g/dL (ref 12.0–15.0)
MCH: 28.5 pg (ref 26.0–34.0)
MCHC: 33.3 g/dL (ref 30.0–36.0)
MCV: 85.4 fL (ref 78.0–100.0)
PLATELETS: 283 10*3/uL (ref 150–400)
RBC: 4.74 MIL/uL (ref 3.87–5.11)
RDW: 14.4 % (ref 11.5–15.5)
WBC: 16.1 10*3/uL — AB (ref 4.0–10.5)

## 2018-04-29 LAB — TYPE AND SCREEN
ABO/RH(D): O POS
ANTIBODY SCREEN: NEGATIVE

## 2018-04-29 MED ORDER — OXYTOCIN 10 UNIT/ML IJ SOLN: 10.0000 [IU] | Freq: Once | INTRAMUSCULAR | Status: DC

## 2018-04-29 MED ORDER — OXYTOCIN 40 UNITS IN LACTATED RINGERS INFUSION - SIMPLE MED
2.5000 [IU]/h | INTRAVENOUS | Status: DC
  Filled 2018-04-29: qty 1000

## 2018-04-29 MED ORDER — LACTATED RINGERS IV SOLN
INTRAVENOUS | Status: DC
  Administered 2018-04-29: 23:00:00 via INTRAVENOUS

## 2018-04-29 MED ORDER — OXYCODONE-ACETAMINOPHEN 5-325 MG PO TABS: 2.0000 | ORAL_TABLET | ORAL | Status: DC | PRN

## 2018-04-29 MED ORDER — ACETAMINOPHEN 325 MG PO TABS: 650.0000 mg | ORAL_TABLET | ORAL | Status: DC | PRN

## 2018-04-29 MED ORDER — FLEET ENEMA 7-19 GM/118ML RE ENEM: 1.0000 | ENEMA | RECTAL | Status: DC | PRN

## 2018-04-29 MED ORDER — OXYCODONE-ACETAMINOPHEN 5-325 MG PO TABS
1.0000 | ORAL_TABLET | ORAL | Status: DC | PRN
  Filled 2018-04-29: qty 1

## 2018-04-29 MED ORDER — LACTATED RINGERS IV SOLN: 500.0000 mL | INTRAVENOUS | Status: DC | PRN

## 2018-04-29 MED ORDER — ONDANSETRON HCL 4 MG/2ML IJ SOLN: 4.0000 mg | Freq: Four times a day (QID) | INTRAMUSCULAR | Status: DC | PRN

## 2018-04-29 MED ORDER — LIDOCAINE HCL (PF) 1 % IJ SOLN
30.0000 mL | INTRAMUSCULAR | Status: DC | PRN
  Administered 2018-04-29: 30 mL via SUBCUTANEOUS
  Filled 2018-04-29: qty 30

## 2018-04-29 MED ORDER — OXYTOCIN BOLUS FROM INFUSION: 500.0000 mL | Freq: Once | INTRAVENOUS | Status: DC

## 2018-04-29 MED ORDER — SOD CITRATE-CITRIC ACID 500-334 MG/5ML PO SOLN: 30.0000 mL | ORAL | Status: DC | PRN

## 2018-04-29 MED ORDER — OXYTOCIN 10 UNIT/ML IJ SOLN
INTRAMUSCULAR | Status: AC
  Administered 2018-04-29: 10 [IU]
  Filled 2018-04-29: qty 1

## 2018-04-29 NOTE — MAU Note (Signed)
Pt reports contractions all day but for the past 1.5hours reports contractions every 5 mins and frequent bowel movements. Pt reports she is having bloody show but denies LOF. Reports good fetal movement. States cervix was 1.5cm last week.

## 2018-04-29 NOTE — H&P (Signed)
OBSTETRIC ADMISSION HISTORY AND PHYSICAL  Allison Barnes is a 27 y.o. female G3P1011 with IUP at [redacted]w[redacted]d by 14 week bedside US presenting for SOL. Patient reports contractions started at approximately 1600. She reports +FMs, No LOF, no VB, no blurry vision, headaches or peripheral edema, and RUQ pain.  She plans on breast feeding. She request POP for birth control. She received her prenatal care at [facility].  Dating: By 14 week bedside US --->  Estimated Date of Delivery: 05/04/18  Sono:    @[redacted]w[redacted]d , CWD, normal anatomy, breech presentation, 264g, 36% EFW    Past Medical History: History reviewed. No pertinent past medical history.  Past Surgical History: Past Surgical History:  Procedure Laterality Date  . TONSILLECTOMY      Obstetrical History: OB History    Gravida  3   Para  1   Term  1   Preterm      AB  1   Living  1     SAB  1   TAB      Ectopic      Multiple      Live Births              Social History: Social History   Socioeconomic History  . Marital status: Married    Spouse name: Not on file  . Number of children: Not on file  . Years of education: Not on file  . Highest education level: Not on file  Occupational History  . Occupation: homemaker  Social Needs  . Financial resource strain: Not on file  . Food insecurity:    Worry: Not on file    Inability: Not on file  . Transportation needs:    Medical: Not on file    Non-medical: Not on file  Tobacco Use  . Smoking status: Never Smoker  . Smokeless tobacco: Never Used  Substance and Sexual Activity  . Alcohol use: Never    Frequency: Never  . Drug use: Never  . Sexual activity: Yes    Partners: Male    Birth control/protection: None  Lifestyle  . Physical activity:    Days per week: Not on file    Minutes per session: Not on file  . Stress: Not on file  Relationships  . Social connections:    Talks on phone: Not on file    Gets together: Not on file    Attends  religious service: Not on file    Active member of club or organization: Not on file    Attends meetings of clubs or organizations: Not on file    Relationship status: Not on file  Other Topics Concern  . Not on file  Social History Narrative  . Not on file    Family History: Family History  Problem Relation Age of Onset  . Diabetes Mother   . Diabetes Sister   . Cancer Sister        thyroid    Allergies: Allergies  Allergen Reactions  . Latex Rash    Medications Prior to Admission  Medication Sig Dispense Refill Last Dose  . acetaminophen (TYLENOL) 500 MG tablet Take by mouth.   Not Taking  . cephALEXin (KEFLEX) 500 MG capsule Take 1 capsule (500 mg total) by mouth 2 (two) times daily. (Patient not taking: Reported on 04/24/2018) 14 capsule 0 Not Taking  . metroNIDAZOLE (FLAGYL) 500 MG tablet Take 1 tablet (500 mg total) by mouth 2 (two) times daily. (Patient not taking: Reported on 04/24/2018)  14 tablet 0 Not Taking  . pantoprazole (PROTONIX) 40 MG tablet Take 1 tablet (40 mg total) by mouth daily. (Patient not taking: Reported on 04/24/2018) 30 tablet 2 Not Taking  . Prenatal Vit-Fe Fumarate-FA (PRENATAL VITAMIN PO) Take by mouth.   Not Taking     Review of Systems   All systems reviewed and negative except as stated in HPI  Blood pressure 127/82, pulse 87, temperature 98.1 F (36.7 C), temperature source Oral, resp. rate 18, height 5\' 5"  (1.651 m), weight 89.8 kg, last menstrual period 08/08/2017, SpO2 97 %. General appearance: alert, cooperative and moderate distress Lungs: clear to auscultation bilaterally Heart: regular rate and rhythm Abdomen: soft, non-tender; bowel sounds normal Pelvic: Cervix was fully dilated and effaced with bulging bag of water.  Extremities: Homans sign is negative, no sign of DVT Presentation: breech Fetal monitoringBaseline: 155 bpm, Variability: Good {> 6 bpm) and Accelerations: Reactive Uterine activityDate/time of onset: 04/29/18,  Frequency: Every 3 minutes and Intensity: strong     Prenatal labs: ABO, Rh: O/RH(D) POSITIVE/-- (04/04 1614) Antibody: NO ANTIBODIES DETECTED (04/04 1614) Rubella: <0.90 (04/04 1614) RPR: NON-REACTIVE (07/15 0818)  HBsAg: NON-REACTIVE (04/04 1614)  HIV: NON-REACTIVE (07/15 0818)  GBS:   negative 1 hr Glucola 87-131-149 Genetic screening  normal Anatomy RU:EAVWUJS:normal   Prenatal Transfer Tool  Maternal Diabetes: No Genetic Screening: Normal Maternal Ultrasounds/Referrals: Normal Fetal Ultrasounds or other Referrals:  None Maternal Substance Abuse:  No Significant Maternal Medications:  None Significant Maternal Lab Results: None  No results found for this or any previous visit (from the past 24 hour(s)).  Patient Active Problem List   Diagnosis Date Noted  . Supervision of other normal pregnancy, antepartum 11/06/2017  . Acne vulgaris 01/09/2015    Assessment/Plan:  Allison Barnes is a 27 y.o. G3P1011 at 4661w2d here for SOL.  #Labor: Progressing naturally without augmentation.  #Pain: Presented too late to get an epidural.  #FWB: Category 1 #ID: GBS negative #MOF: Breast #MOC: POP #Circ:  Patient would like circumcision before discharge.  Charyl Dancerrin Pavlos Yon, Student-PA  04/29/2018, 10:57 PM

## 2018-04-29 NOTE — Telephone Encounter (Signed)
Returning pt call. PT left a message stating she she had a bowel movement and then noticed a blood clot. After calling the patient back and speaking to her she says that it is mucous and blood and she has been seeing the mucus for a few days. I explained to pt that she is losing her mucous plug and that is normal. After speaking to Armandina StammerJennifer Howard, RN I told pt if she is having just bleeding or blood clots then [facility] should go to [facility]. I also told her if she does not feel fetal movement or has leakage of fluid then she should go to [facility]. Pt expressed understanding.

## 2018-04-30 ENCOUNTER — Other Ambulatory Visit: Payer: Self-pay

## 2018-04-30 ENCOUNTER — Encounter (HOSPITAL_COMMUNITY): Payer: Self-pay

## 2018-04-30 DIAGNOSIS — Z3A39 39 weeks gestation of pregnancy: Secondary | ICD-10-CM

## 2018-04-30 LAB — ABO/RH: ABO/RH(D): O POS

## 2018-04-30 LAB — RPR: RPR Ser Ql: NONREACTIVE

## 2018-04-30 MED ORDER — COCONUT OIL OIL
1.0000 "application " | TOPICAL_OIL | Status: DC | PRN
  Filled 2018-04-30: qty 120

## 2018-04-30 MED ORDER — ONDANSETRON HCL 4 MG PO TABS: 4.0000 mg | ORAL_TABLET | ORAL | Status: DC | PRN

## 2018-04-30 MED ORDER — ACETAMINOPHEN 325 MG PO TABS
650.0000 mg | ORAL_TABLET | ORAL | Status: DC | PRN
  Administered 2018-04-30: 650 mg via ORAL
  Filled 2018-04-30: qty 2

## 2018-04-30 MED ORDER — ONDANSETRON HCL 4 MG/2ML IJ SOLN: 4.0000 mg | INTRAMUSCULAR | Status: DC | PRN

## 2018-04-30 MED ORDER — PRENATAL MULTIVITAMIN CH
1.0000 | ORAL_TABLET | Freq: Every day | ORAL | Status: DC
  Administered 2018-04-30: 1 via ORAL
  Filled 2018-04-30: qty 1

## 2018-04-30 MED ORDER — DIPHENHYDRAMINE HCL 25 MG PO CAPS: 25.0000 mg | ORAL_CAPSULE | Freq: Four times a day (QID) | ORAL | Status: DC | PRN

## 2018-04-30 MED ORDER — SIMETHICONE 80 MG PO CHEW: 80.0000 mg | CHEWABLE_TABLET | ORAL | Status: DC | PRN

## 2018-04-30 MED ORDER — BENZOCAINE-MENTHOL 20-0.5 % EX AERO
1.0000 "application " | INHALATION_SPRAY | CUTANEOUS | Status: DC | PRN
  Administered 2018-04-30: 1 via TOPICAL
  Filled 2018-04-30: qty 56

## 2018-04-30 MED ORDER — SENNOSIDES-DOCUSATE SODIUM 8.6-50 MG PO TABS
2.0000 | ORAL_TABLET | ORAL | Status: DC
  Administered 2018-04-30: 2 via ORAL
  Filled 2018-04-30: qty 2

## 2018-04-30 MED ORDER — TETANUS-DIPHTH-ACELL PERTUSSIS 5-2.5-18.5 LF-MCG/0.5 IM SUSP: 0.5000 mL | Freq: Once | INTRAMUSCULAR | Status: DC

## 2018-04-30 MED ORDER — DIBUCAINE 1 % RE OINT: 1.0000 "application " | TOPICAL_OINTMENT | RECTAL | Status: DC | PRN

## 2018-04-30 MED ORDER — ZOLPIDEM TARTRATE 5 MG PO TABS: 5.0000 mg | ORAL_TABLET | Freq: Every evening | ORAL | Status: DC | PRN

## 2018-04-30 MED ORDER — OXYCODONE-ACETAMINOPHEN 5-325 MG PO TABS
1.0000 | ORAL_TABLET | ORAL | Status: DC | PRN
  Administered 2018-04-30 (×3): 1 via ORAL
  Filled 2018-04-30 (×2): qty 1

## 2018-04-30 MED ORDER — IBUPROFEN 600 MG PO TABS
600.0000 mg | ORAL_TABLET | Freq: Four times a day (QID) | ORAL | Status: DC
  Administered 2018-04-30 – 2018-05-01 (×6): 600 mg via ORAL
  Filled 2018-04-30 (×5): qty 1

## 2018-04-30 MED ORDER — WITCH HAZEL-GLYCERIN EX PADS: 1.0000 "application " | MEDICATED_PAD | CUTANEOUS | Status: DC | PRN

## 2018-04-30 NOTE — H&P (Signed)
Allison Barnes is a 27 y.o. female presenting for uterine contractions since 4pm.  Feeling some pressure with contractions now.   Copied from student note: Allison Barnes is a 27 y.o. female G3P1011 with IUP at [redacted]w[redacted]d by 14 week bedside US presenting for SOL. Patient reports contractions started at approximately 1600. She reports +FMs, No LOF, no VB, no blurry vision, headaches or peripheral edema, and RUQ pain.  She plans on breast feeding. She request POP for birth control. She received her prenatal care at [facility]. . OB History    Gravida  3   Para  1   Term  1   Preterm      AB  1   Living  1     SAB  1   TAB      Ectopic      Multiple      Live Births             History reviewed. No pertinent past medical history. Past Surgical History:  Procedure Laterality Date  . TONSILLECTOMY     Family History: family history includes Cancer in her sister; Diabetes in her mother and sister. Social History:  reports that she has never smoked. She has never used smokeless tobacco. She reports that she does not drink alcohol or use drugs.     Maternal Diabetes: No Genetic Screening: Normal Maternal Ultrasounds/Referrals: Normal Fetal Ultrasounds or other Referrals:  None Maternal Substance Abuse:  No Significant Maternal Medications:  None Significant Maternal Lab Results:  GBS negative Other Comments:  None  Review of Systems  Constitutional: Negative for chills and fever.  Cardiovascular: Negative for leg swelling.  Gastrointestinal: Positive for abdominal pain. Negative for constipation, diarrhea, nausea and vomiting.  Genitourinary: Negative for dysuria.  Neurological: Negative for dizziness and focal weakness.   Maternal Medical History:  Reason for admission: Contractions.  Nausea.  Contractions: Onset was 6-12 hours ago.   Frequency: regular.   Perceived severity is strong.    Fetal activity: Perceived fetal activity is normal.   Last perceived fetal  movement was within the past hour.    Prenatal complications: No bleeding, PIH, infection, placental abnormality, pre-eclampsia or preterm labor.   Prenatal Complications - Diabetes: none.    Dilation: 9 Effacement (%): 100 Station: -1 Exam by:: Mary [place] Johnson, RN  Blood pressure 118/70, pulse 92, temperature 98.1 F (36.7 C), temperature source Oral, resp. rate 17, height 5\' 5"  (1.651 m), weight 89.8 kg, last menstrual period 08/08/2017, SpO2 99 %. Maternal Exam:  Uterine Assessment: Contraction strength is firm.  Contraction frequency is regular.   Abdomen: Patient reports no abdominal tenderness. Fetal presentation: vertex  Introitus: Normal vulva. Normal vagina.  Ferning test: not done.  Nitrazine test: not done. Amniotic fluid character: not assessed.  Pelvis: adequate for delivery.   Cervix: Cervix evaluated by digital exam.     Fetal Exam Fetal Monitor Review: Mode: ultrasound.   Baseline rate: 140.  Variability: moderate (6-25 bpm).   Pattern: accelerations present and variable decelerations.    Fetal State Assessment: Category I - tracings are normal.     Physical Exam  Constitutional: She is oriented to person, place, and time. She appears well-developed and well-nourished. No distress.  HENT:  Head: Normocephalic.  Cardiovascular: Normal rate and regular rhythm.  Respiratory: Effort normal. No respiratory distress. She has no wheezes. She has no rales.  GI: Soft. She exhibits no distension. There is no tenderness. There is no rebound and no guarding.  Genitourinary:  Genitourinary Comments: Cervix 9+/BBOW  Musculoskeletal: Normal range of motion.  Neurological: She is alert and oriented to person, place, and time.  Skin: Skin is warm and dry.  Psychiatric: She has a normal mood and affect.    Prenatal labs: ABO, Rh: --/--/O POS (09/25 2302) Antibody: NEG (09/25 2302) Rubella: <0.90 (04/04 1614) RPR: NON-REACTIVE (07/15 0818)  HBsAg: NON-REACTIVE  (04/04 1614)  HIV: NON-REACTIVE (07/15 0818)  GBS: Negative (09/13 0000)   Assessment/Plan: Single intrauterine pregnancy at [redacted]w[redacted]d Active labor, transition GBS Negative  Admit to [facility] Routine orders Anticipate SVD soon   Wynelle Bourgeois 04/30/2018, 12:51 AM

## 2018-04-30 NOTE — Lactation Note (Signed)
This note was copied from a baby's chart. Lactation Consultation Note Baby 6 hrs old. Experienced BF mom stated baby is BF great. States he's latching really good. Denies pain. Mom has large breast w/small short shaft compressible nipples. Mom states she BF her 25 yr old daughter for 1 yr w/o difficulty. Mom latched baby in cross cradle position. Discussed body alignment, STS, unwrapped blanket, I&O, breast massage, cluster feeding, supply and demand. Mom asked a few questions that LC answered. Encouraged to call for assistance or further questions. WH/LC brochure given w/resources, support groups and LC services.  Patient Name: Allison Barnes [id number] Date: 04/30/2018 Reason for consult: Initial assessment   Maternal Data Has patient been taught Hand Expression?: Yes Does the patient have breastfeeding experience prior to this delivery?: Yes  Feeding Feeding Type: Breast Fed Length of feed: 5 min(still BF)  LATCH Score Latch: Grasps breast easily, tongue down, lips flanged, rhythmical sucking.  Audible Swallowing: Spontaneous and intermittent  Type of Nipple: Everted at rest and after stimulation(short shaft)  Comfort (Breast/Nipple): Soft / non-tender  Hold (Positioning): No assistance needed to correctly position infant at breast.  LATCH Score: 10  Interventions Interventions: Breast feeding basics reviewed;Support pillows;Skin to skin;Position options;Breast massage;Breast compression  Lactation Tools Discussed/Used WIC Program: No   Consult Status Consult Status: Follow-up Date: 05/01/18 Follow-up type: In-patient    Allison Barnes, Diamond Nickel 04/30/2018, 6:18 AM

## 2018-04-30 NOTE — Progress Notes (Signed)
Post Partum Day 1 Subjective: no complaints, up ad lib, voiding and tolerating PO  Objective: Blood pressure 122/80, pulse 90, temperature 98 F (36.7 C), temperature source Oral, resp. rate 18, height 5\' 5"  (1.651 m), weight 89.8 kg, last menstrual period 08/08/2017, SpO2 99 %.  Physical Exam:  General: alert, cooperative and no distress Lochia: appropriate Uterine Fundus: firm Incision: n/a DVT Evaluation: No evidence of DVT seen on physical exam.  Recent Labs    04/29/18 2302  HGB 13.5  HCT 40.5    Assessment/Plan: Plan for discharge tomorrow and Breastfeeding   LOS: 1 day   Wynelle Bourgeois 04/30/2018, 7:14 AM

## 2018-05-01 ENCOUNTER — Encounter: Payer: BLUE CROSS/BLUE SHIELD | Admitting: Certified Nurse Midwife

## 2018-05-01 MED ORDER — MEASLES, MUMPS & RUBELLA VAC ~~LOC~~ INJ
0.5000 mL | INJECTION | Freq: Once | SUBCUTANEOUS | Status: AC
  Administered 2018-05-01: 0.5 mL via SUBCUTANEOUS
  Filled 2018-05-01: qty 0.5

## 2018-05-01 MED ORDER — IBUPROFEN 600 MG PO TABS: 600.0000 mg | ORAL_TABLET | Freq: Four times a day (QID) | ORAL | 0 refills | Status: AC

## 2018-05-01 NOTE — Discharge Summary (Addendum)
OB Discharge Summary     Patient Name: Allison Barnes DOB: July 15, 1991 MRN: [id number]  Date of admission: 04/29/2018 Delivering MD: Aviva Signs   Date of discharge: 05/01/2018  Admitting diagnosis: ctx Intrauterine pregnancy: [redacted]w[redacted]d     Secondary diagnosis:  Active Problems:   Uterine contractions during pregnancy  Additional problems: none     Discharge diagnosis: Term Pregnancy Delivered                                                                                                Post partum procedures:none  Augmentation: none  Complications: None  Hospital course:  Onset of Labor With Vaginal Delivery     27 y.o. yo G3P1011 at [redacted]w[redacted]d was admitted in Active Labor on 04/29/2018. Patient had an uncomplicated labor course as follows:  Membrane Rupture Time/Date: 11:32 PM ,04/29/2018   Intrapartum Procedures: Episiotomy: None [1]                                         Lacerations:  2nd degree [3];1st degree [2];Labial [10]  Patient had a delivery of a Viable infant. 04/29/2018  Information for the patient's newborn:  [place], Boys [[id number]]  Delivery Method: Vag-Spont    Pateint had an uncomplicated postpartum course.  She is ambulating, tolerating a regular diet, passing flatus, and urinating well. Patient is discharged home in stable condition on 05/01/18.   Physical exam  Vitals:   04/30/18 0329 04/30/18 0610 04/30/18 1332 04/30/18 2230  BP: 122/78 122/80 125/81 121/86  Pulse: 88 90 (!) 107 89  Resp: 18 18 17 18   Temp: 97.9 F (36.6 C) 98 F (36.7 C) 98.5 F (36.9 C) 98.2 F (36.8 C)  TempSrc: Oral Oral Oral Oral  SpO2: 99% 99%  98%  Weight:      Height:       General: alert, cooperative and no distress Lochia: appropriate Uterine Fundus: firm Incision: N/A DVT Evaluation: No evidence of DVT seen on physical exam. Labs: Lab Results  Component Value Date   WBC 16.1 (H) 04/29/2018   HGB 13.5 04/29/2018   HCT 40.5 04/29/2018   MCV 85.4 04/29/2018    PLT 283 04/29/2018   No flowsheet data found.  Discharge instruction: per After Visit Summary and "Baby and Me Booklet".  After visit meds:  Allergies as of 05/01/2018      Reactions   Latex Rash      Medication List    TAKE these medications   acetaminophen 500 MG tablet Commonly known as:  TYLENOL Take 500 mg by mouth as needed for mild pain.   ibuprofen 600 MG tablet Commonly known as:  ADVIL,MOTRIN Take 1 tablet (600 mg total) by mouth every 6 (six) hours.   pantoprazole 40 MG tablet Commonly known as:  PROTONIX Take 1 tablet (40 mg total) by mouth daily.   PRENATAL VITAMIN PO Take by mouth.       Diet: routine diet  Activity: Advance as tolerated. Pelvic rest for  6 weeks.   Outpatient follow up:6 weeks Follow up Appt:No future appointments. Follow up Visit:No follow-ups on file.  Postpartum contraception: Combination OCPs  Newborn Data: Live born female  Birth Weight: 8 lb 9.6 oz (3901 g) APGAR: 9, 9  Newborn Delivery   Birth date/time:  04/29/2018 23:47:00 Delivery type:  Vaginal, Spontaneous     Baby Feeding: Breast Disposition:home with mother   05/01/2018 Mirian Mo, MD   I have seen and examined this patient and agree the above assessment.  Respiratory effort normal, lochia appropriate, legs negative,  pain level normal.  Jacklyn Shell 05/02/2018 10:51 AM

## 2018-05-01 NOTE — Discharge Instructions (Signed)
Vaginal Delivery, Care After °Refer to this sheet in the next few weeks. These instructions provide you with information about caring for yourself after vaginal delivery. Your health care provider may also give you more specific instructions. Your treatment has been planned according to current medical practices, but problems sometimes occur. Call your health care provider if you have any problems or questions. °What can I expect after the procedure? °After vaginal delivery, it is common to have: °· Some bleeding from your vagina. °· Soreness in your abdomen, your vagina, and the area of skin between your vaginal opening and your anus (perineum). °· Pelvic cramps. °· Fatigue. ° °Follow these instructions at home: °Medicines °· Take over-the-counter and prescription medicines only as told by your health care provider. °· If you were prescribed an antibiotic medicine, take it as told by your health care provider. Do not stop taking the antibiotic until it is finished. °Driving ° °· Do not drive or operate heavy machinery while taking prescription pain medicine. °· Do not drive for 24 hours if you received a sedative. °Lifestyle °· Do not drink alcohol. This is especially important if you are breastfeeding or taking medicine to relieve pain. °· Do not use tobacco products, including cigarettes, chewing tobacco, or e-cigarettes. If you need help quitting, ask your health care provider. °Eating and drinking °· Drink at least 8 eight-ounce glasses of water every day unless you are told not to by your health care provider. If you choose to breastfeed your baby, you may need to drink more water than this. °· Eat high-fiber foods every day. These foods may help prevent or relieve constipation. High-fiber foods include: °? Whole grain cereals and breads. °? Brown rice. °? Beans. °? Fresh fruits and vegetables. °Activity °· Return to your normal activities as told by your health care provider. Ask your health care provider  what activities are safe for you. °· Rest as much as possible. Try to rest or take a nap when your baby is sleeping. °· Do not lift anything that is heavier than your baby or 10 lb (4.5 kg) until your health care provider says that it is safe. °· Talk with your health care provider about when you can engage in sexual activity. This may depend on your: °? Risk of infection. °? Rate of healing. °? Comfort and desire to engage in sexual activity. °Vaginal Care °· If you have an episiotomy or a vaginal tear, check the area every day for signs of infection. Check for: °? More redness, swelling, or pain. °? More fluid or blood. °? Warmth. °? Pus or a bad smell. °· Do not use tampons or douches until your health care provider says this is safe. °· Watch for any blood clots that may pass from your vagina. These may look like clumps of dark red, brown, or black discharge. °General instructions °· Keep your perineum clean and dry as told by your health care provider. °· Wear loose, comfortable clothing. °· Wipe from front to back when you use the toilet. °· Ask your health care provider if you can shower or take a bath. If you had an episiotomy or a perineal tear during labor and delivery, your health care provider may tell you not to take baths for a certain length of time. °· Wear a bra that supports your breasts and fits you well. °· If possible, have someone help you with household activities and help care for your baby for at least a few days after   you leave the hospital. °· Keep all follow-up visits for you and your baby as told by your health care provider. This is important. °Contact a health care provider if: °· You have: °? Vaginal discharge that has a bad smell. °? Difficulty urinating. °? Pain when urinating. °? A sudden increase or decrease in the frequency of your bowel movements. °? More redness, swelling, or pain around your episiotomy or vaginal tear. °? More fluid or blood coming from your episiotomy or  vaginal tear. °? Pus or a bad smell coming from your episiotomy or vaginal tear. °? A fever. °? A rash. °? Little or no interest in activities you used to enjoy. °? Questions about caring for yourself or your baby. °· Your episiotomy or vaginal tear feels warm to the touch. °· Your episiotomy or vaginal tear is separating or does not appear to be healing. °· Your breasts are painful, hard, or turn red. °· You feel unusually sad or worried. °· You feel nauseous or you vomit. °· You pass large blood clots from your vagina. If you pass a blood clot from your vagina, save it to show to your health care provider. Do not flush blood clots down the toilet without having your health care provider look at them. °· You urinate more than usual. °· You are dizzy or light-headed. °· You have not breastfed at all and you have not had a menstrual period for 12 weeks after delivery. °· You have stopped breastfeeding and you have not had a menstrual period for 12 weeks after you stopped breastfeeding. °Get help right away if: °· You have: °? Pain that does not go away or does not get better with medicine. °? Chest pain. °? Difficulty breathing. °? Blurred vision or spots in your vision. °? Thoughts about hurting yourself or your baby. °· You develop pain in your abdomen or in one of your legs. °· You develop a severe headache. °· You faint. °· You bleed from your vagina so much that you fill two sanitary pads in one hour. °This information is not intended to replace advice given to you by your health care provider. Make sure you discuss any questions you have with your health care provider. °Document Released: 07/19/2000 Document Revised: 01/03/2016 Document Reviewed: 08/06/2015 °Elsevier Interactive Patient Education © 2018 [employer] ° °

## 2018-05-01 NOTE — Lactation Note (Signed)
This note was copied from a baby's chart. Lactation Consultation Note Experienced BF mom states this baby has BF great since birth. Better than her other 2 children. Baby is 40 hrs old. Mom plans on d/c home today.  Reviewed cont. I&O, breast massage, and STS. Mom states she knows about engorgement, pump and management. Reminded about [facility] and LC OP services.  Mom stated she had no further questions or needs from lactation.    Patient Name: Allison Barnes [id number] Date: 05/01/2018 Reason for consult: Follow-up assessment   Maternal Data    Feeding    LATCH Score                   Interventions    Lactation Tools Discussed/Used     Consult Status Consult Status: Complete Date: 05/01/18    Allison Barnes 05/01/2018, 3:28 AM

## 2018-05-04 ENCOUNTER — Telehealth: Payer: Self-pay | Admitting: *Deleted

## 2018-05-04 NOTE — Telephone Encounter (Signed)
-----   Message from Granville Lewis, RN sent at 05/04/2018  8:44 AM EDT ----- Regarding: FW: Delivery   ----- Message ----- From: Aviva Signs, CNM Sent: 04/30/2018  12:57 AM EDT To: Granville Lewis, RN, [facility] Billing/Coding Subject: Delivery                                       Vaginal Delivery by Dr Homero Fellers I supervised Wynelle Bourgeois CNM   Please schedule this patient for Postpartum visit in: 4 weeks with the following provider: Any provider For C/S patients schedule nurse incision check in weeks 2 weeks: no Low risk pregnancy complicated by: none Delivery mode:  SVD Anticipated Birth Control:  other/unsure PP Procedures needed: none  Schedule Integrated BH visit: no

## 2018-05-04 NOTE — Telephone Encounter (Signed)
Left patient a message to call and schedule 4 week Postpartum appointment. Spoke with patient last week while still at the hospital but she wasn't ready to schedule.

## 2018-06-01 ENCOUNTER — Ambulatory Visit (INDEPENDENT_AMBULATORY_CARE_PROVIDER_SITE_OTHER): Payer: BLUE CROSS/BLUE SHIELD | Admitting: Obstetrics & Gynecology

## 2018-06-01 ENCOUNTER — Encounter: Payer: Self-pay | Admitting: Obstetrics & Gynecology

## 2018-06-01 DIAGNOSIS — Z1389 Encounter for screening for other disorder: Secondary | ICD-10-CM

## 2018-06-01 MED ORDER — NORETHINDRONE 0.35 MG PO TABS: 1.0000 | ORAL_TABLET | Freq: Every day | ORAL | 11 refills | Status: AC

## 2018-06-01 NOTE — Progress Notes (Signed)
Post Partum Exam  Allison Barnes is a 27 y.o. G55P1011 female who presents for a postpartum visit. She is 4 weeks postpartum following a spontaneous vaginal delivery 2nd degree labial laceration. I have fully reviewed the prenatal and intrapartum course. The delivery was at [redacted]w[redacted]d gestational weeks.  Anesthesia: none. Postpartum course has been unremarkable. Baby's course has been normal. Baby is feeding by breast. Bleeding staining only. Bowel function is normal. Bladder function is normal. Patient is not sexually active. Contraception method is oral progesterone-only contraceptive. Postpartum depression screening:neg  The following portions of the patient's history were reviewed and updated as appropriate: allergies, current medications, past family history, past medical history, past social history, past surgical history and problem list. Last pap smear done 2019 and was Normal  Review of Systems Pertinent items are noted in HPI.    Objective:  Last menstrual period 08/08/2017.  General:  alert   Breasts:  inspection negative, no nipple discharge or bleeding, no masses or nodularity palpable  Lungs: clear to auscultation bilaterally  Heart:  regular rate and rhythm, S1, S2 normal, no murmur, click, rub or gallop  Abdomen: soft, non-tender; bowel sounds normal; no masses,  no organomegaly   Vulva:  normal  Vagina: normal vagina, tear healing well, atrophy noted  Cervix:  anteverted  Corpus: normal  Adnexa:  not evaluated  Rectal Exam: Not performed.        Assessment:    Normal postpartum exam. Pap smear not done at today's visit.   Plan:   1. Contraception: oral progesterone-only contraceptive 2. Rec that she wait another 2 weeks prior to sex 3. Follow up in: 1 year or as needed.

## 2018-11-06 IMAGING — DX DG CHEST 2V
2 series · 2 of 2 positions shown · non-contrast
Comparison: 02/18/2013

CLINICAL DATA: Shortness of breath with productive cough.

EXAM:
CHEST - 2 VIEW

[chest pa]
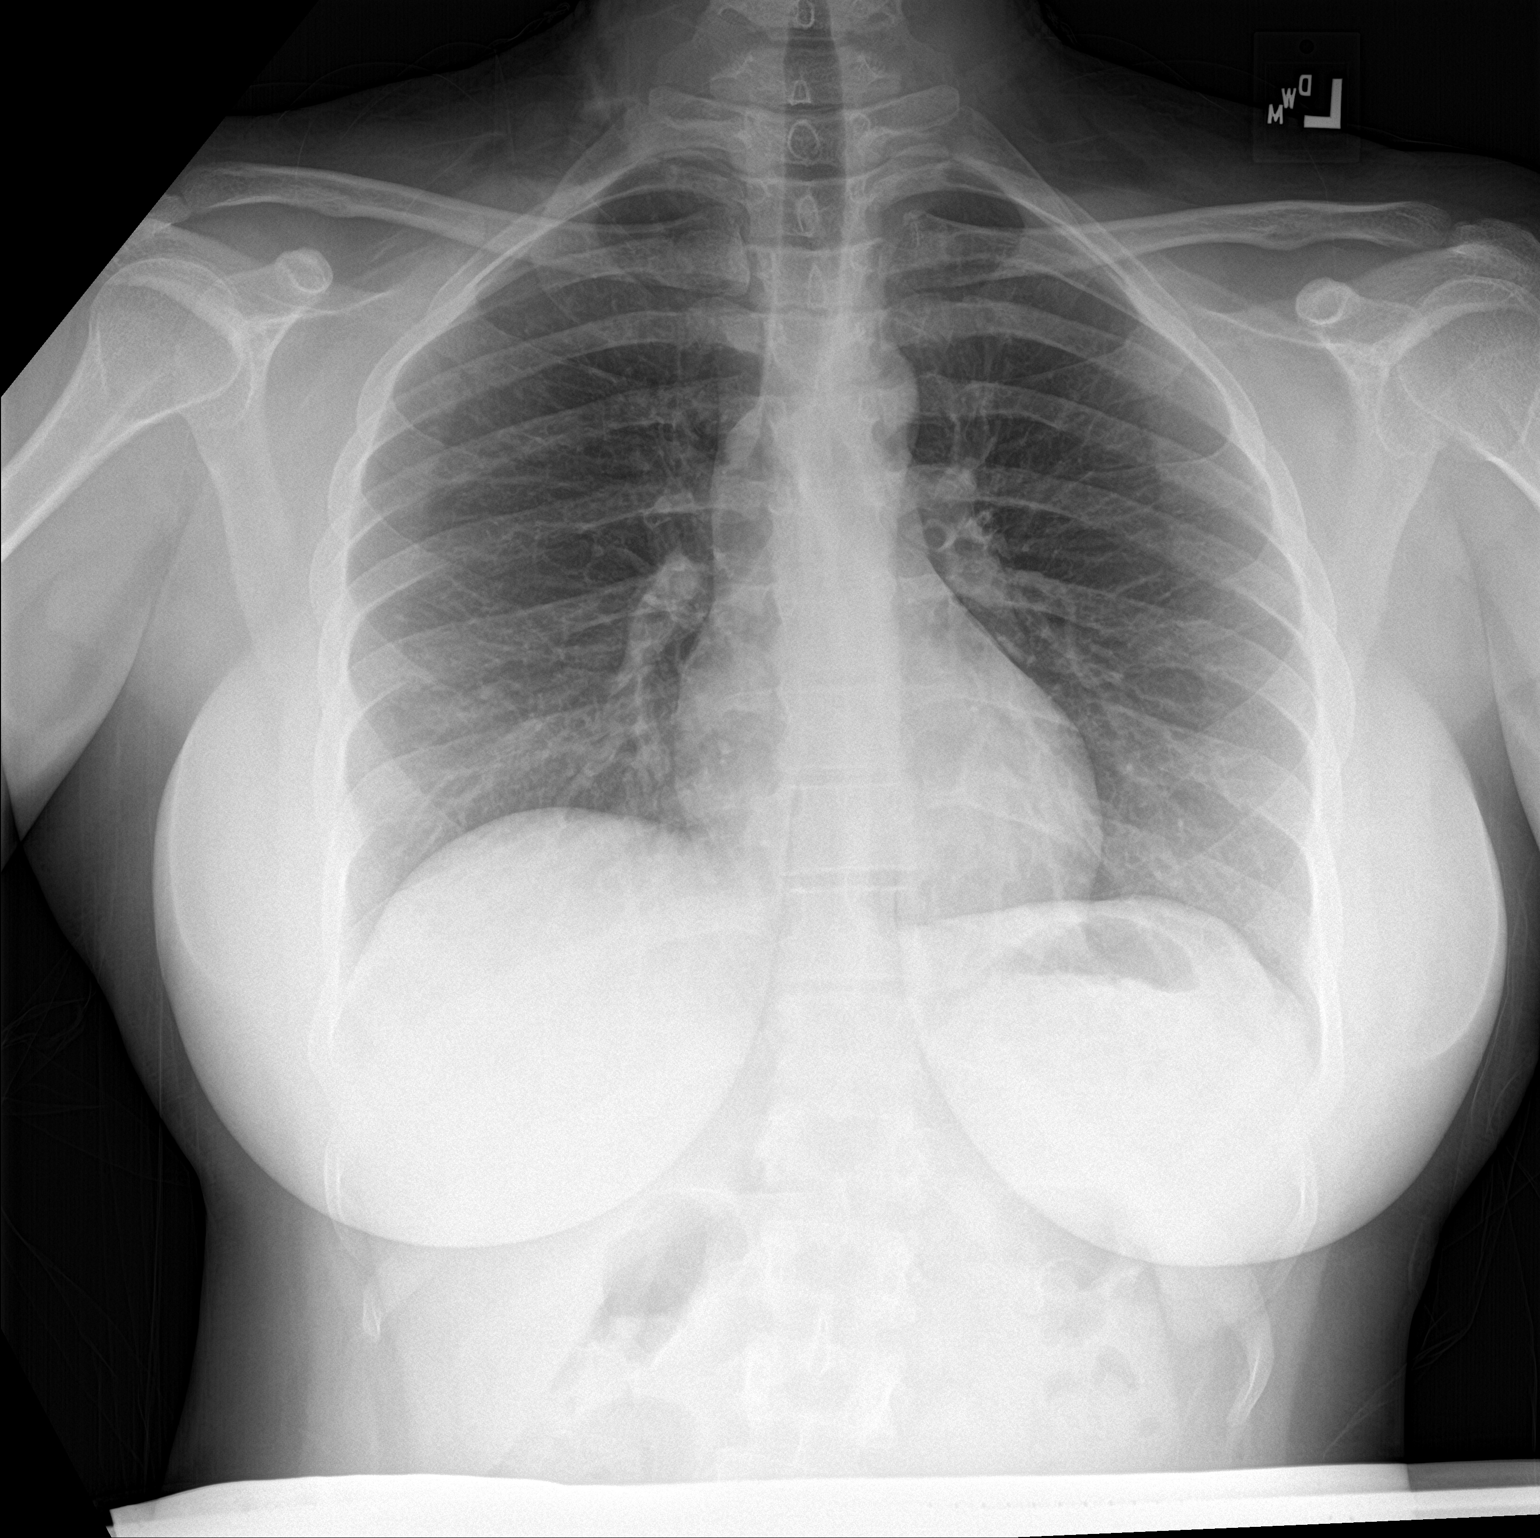

[chest lat]
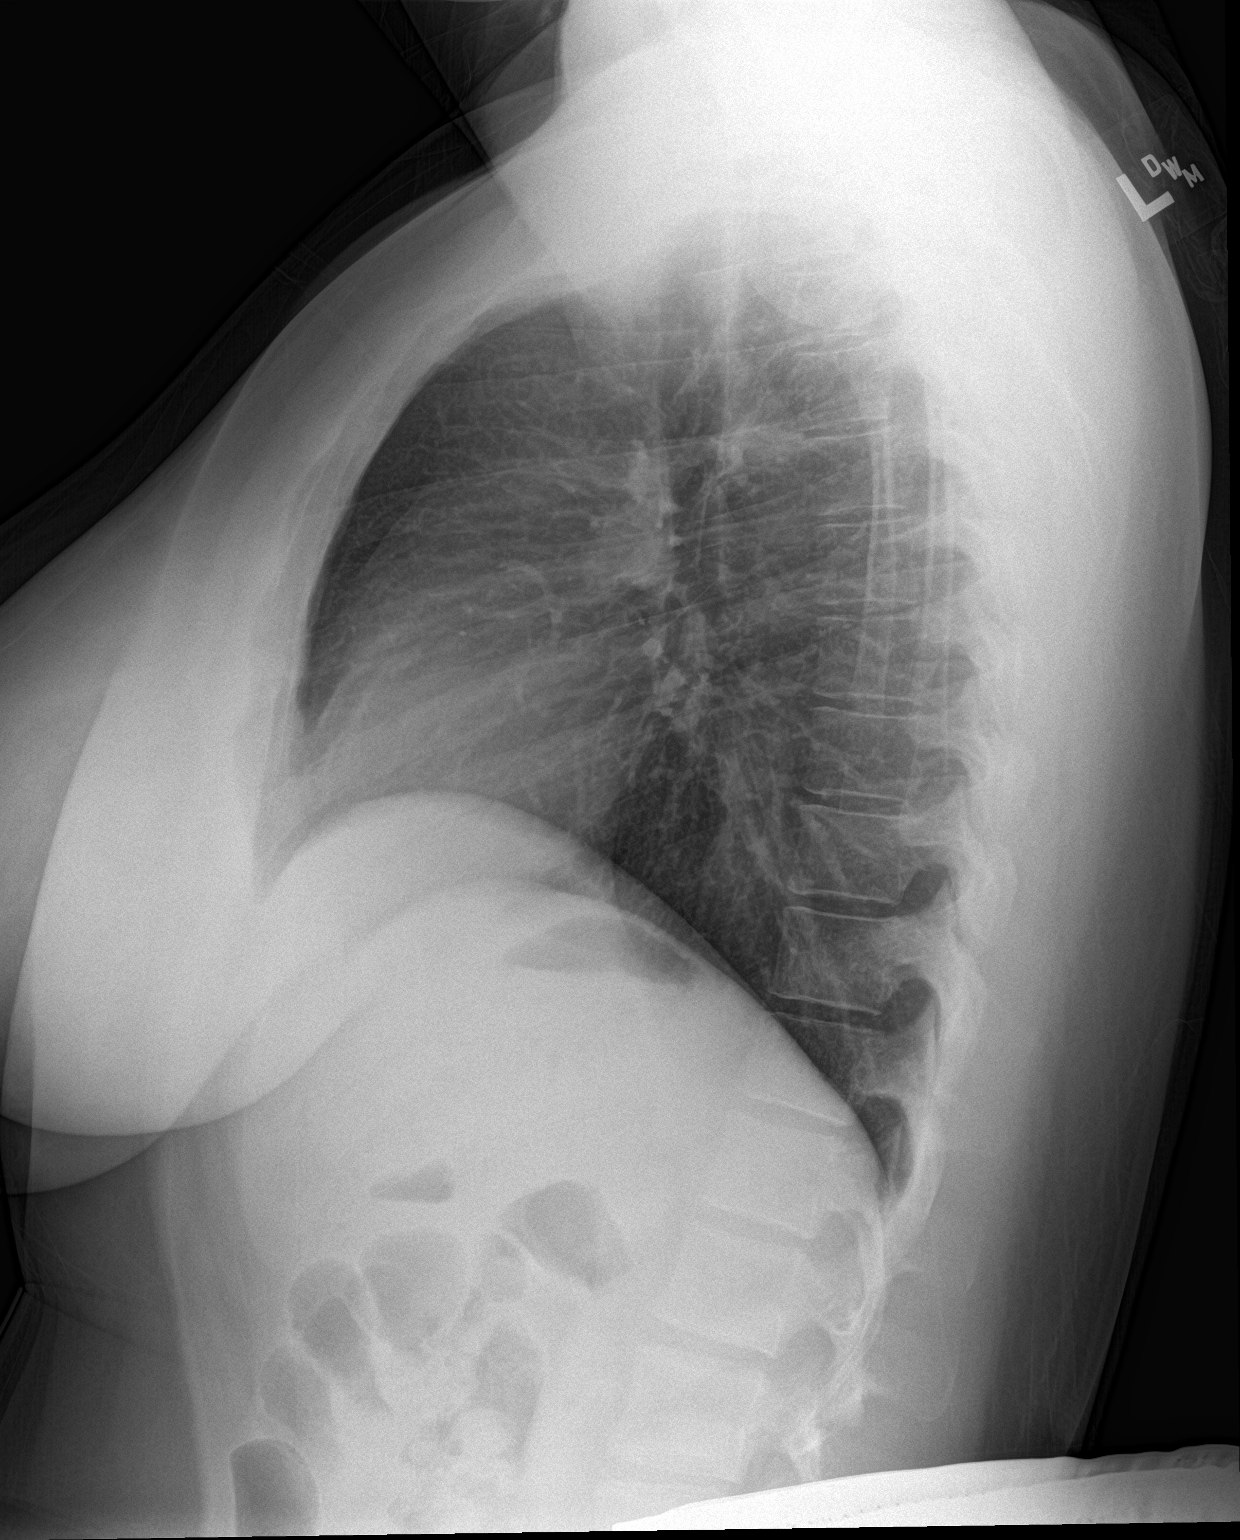

[2 of 2 positions shown; findings below may reference images not displayed]

FINDINGS: Both lungs are clear. Heart and mediastinum are within normal
limits. Trachea is midline. Bone structures are intact. No large
pleural effusions.
IMPRESSION: No active cardiopulmonary disease.

## 2019-02-03 IMAGING — US US MFM OB FOLLOW-UP
1 series · 14 of 28 positions shown · non-contrast
Comparison: none

[Series 1: us mfm ob follow-up · 14 of 61 slices shown]
[im 3/61]
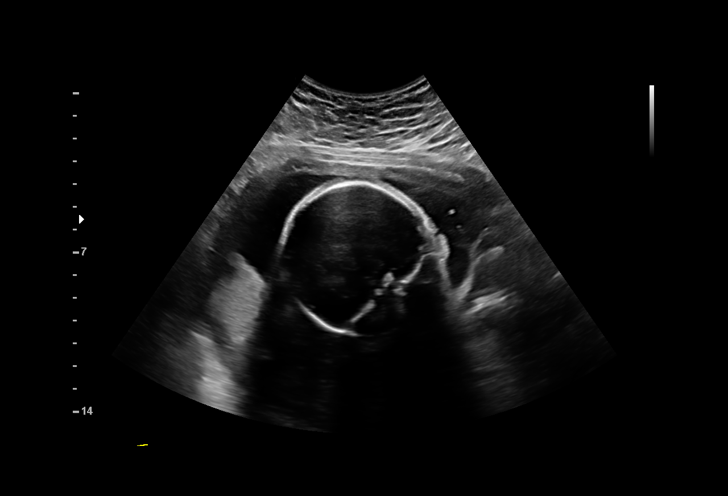
[im 7/61]
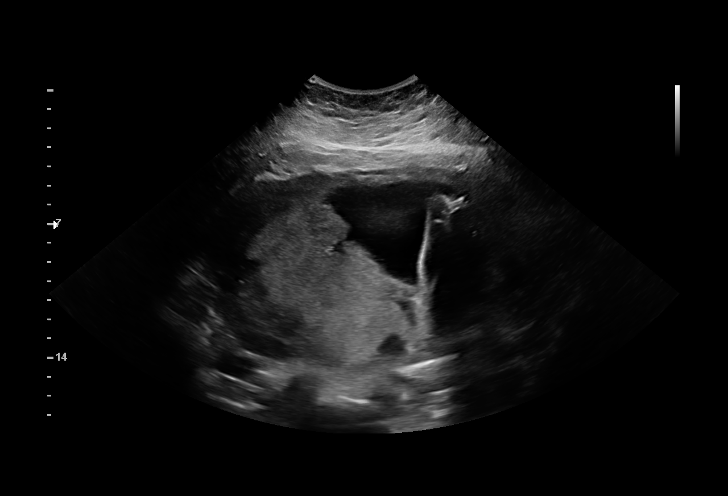
[im 12/61]
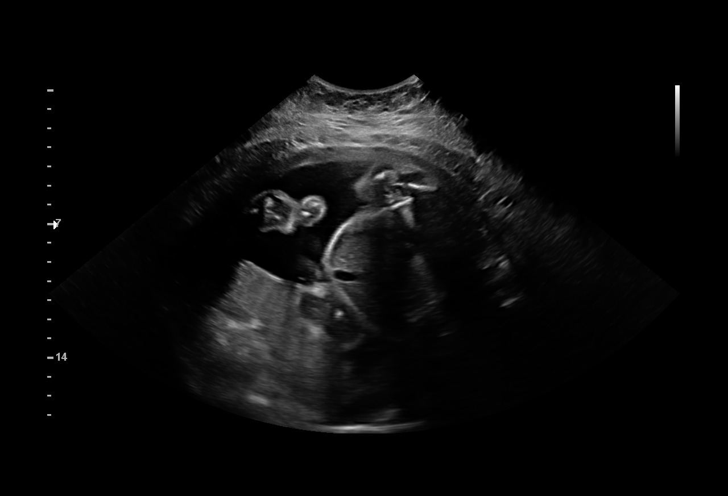
[im 16/61]
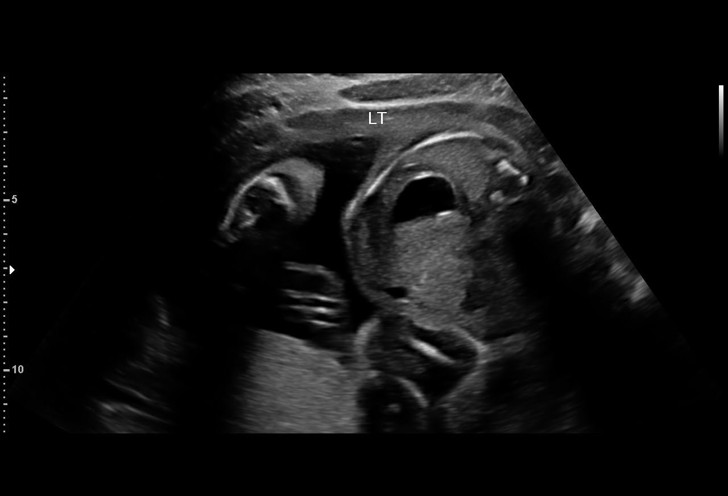
[im 21/61]
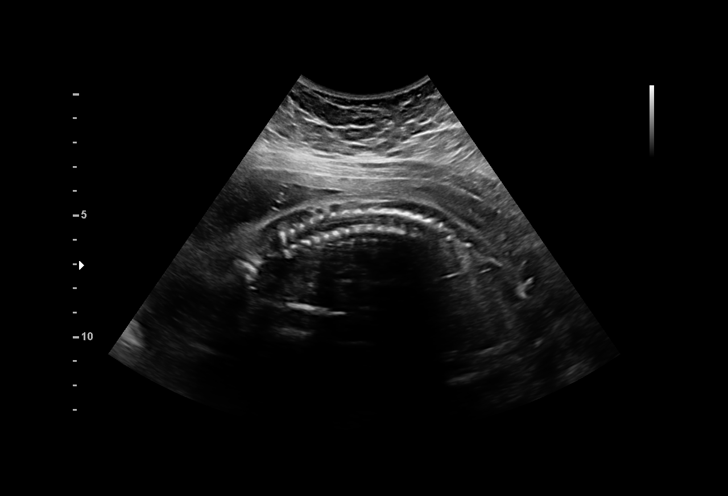
[im 25/61]
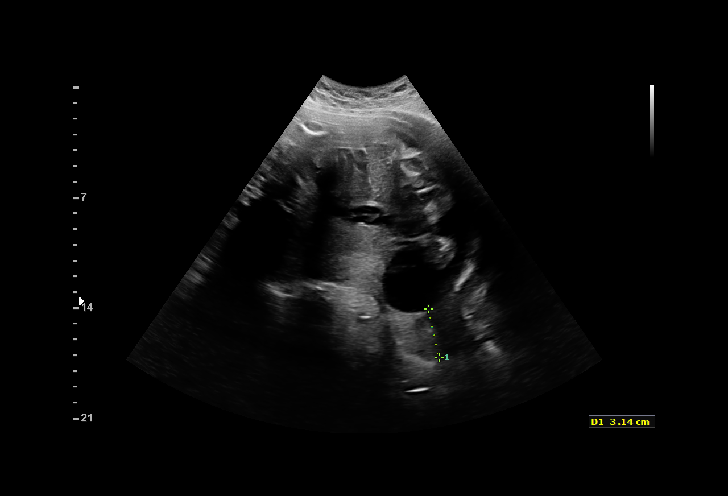
[im 29/61]
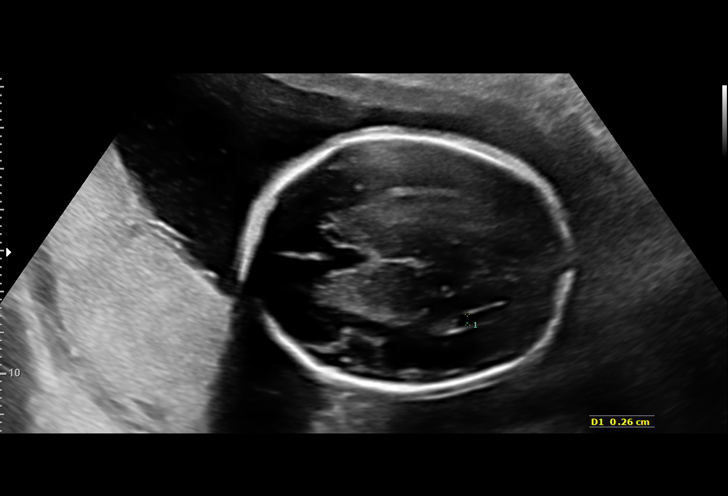
[im 34/61]
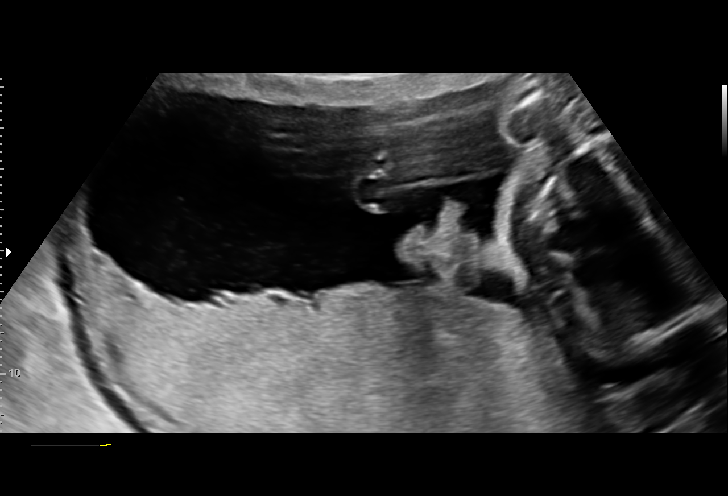
[im 38/61]
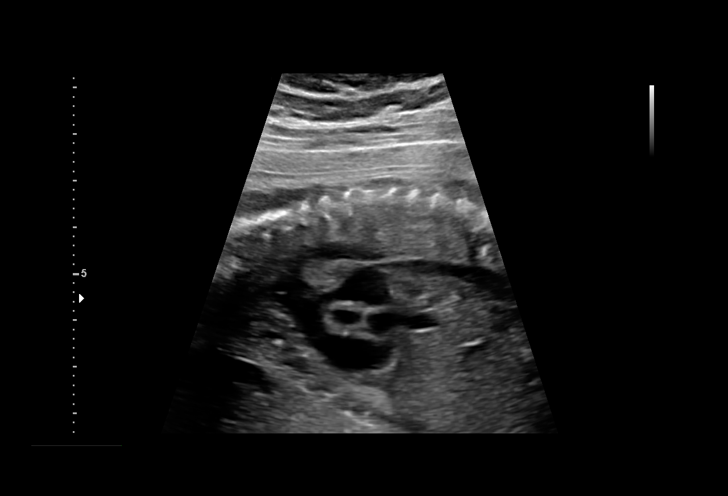
[im 43/61]
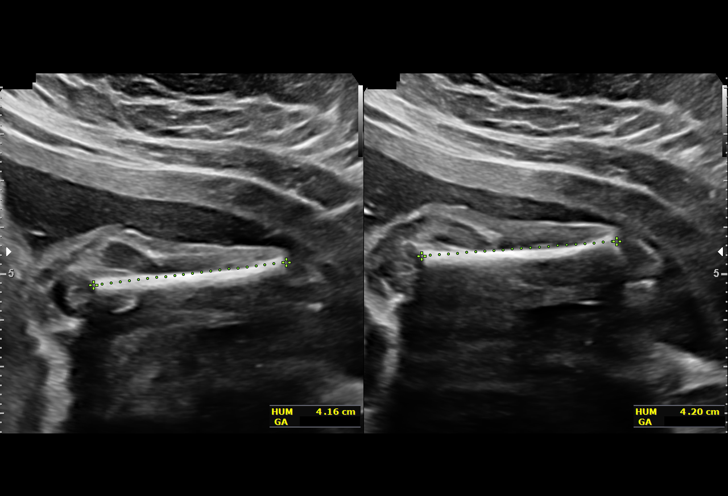
[im 47/61]
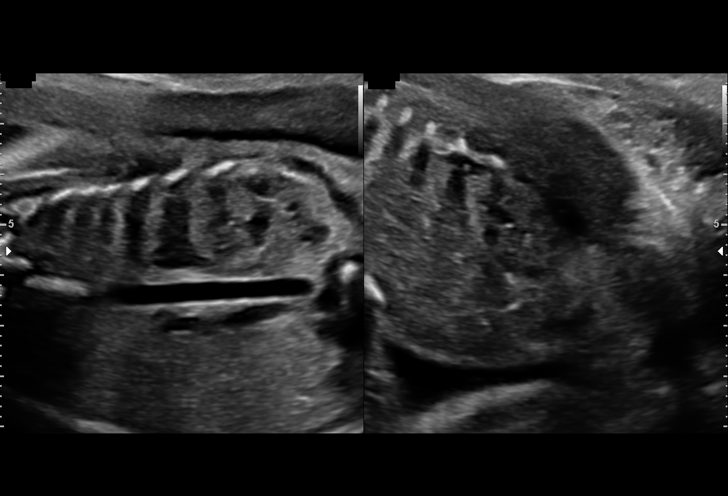
[im 52/61]
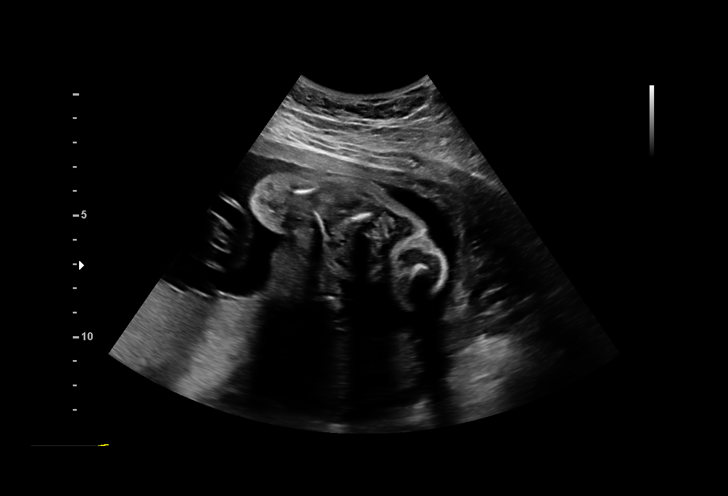
[im 56/61]
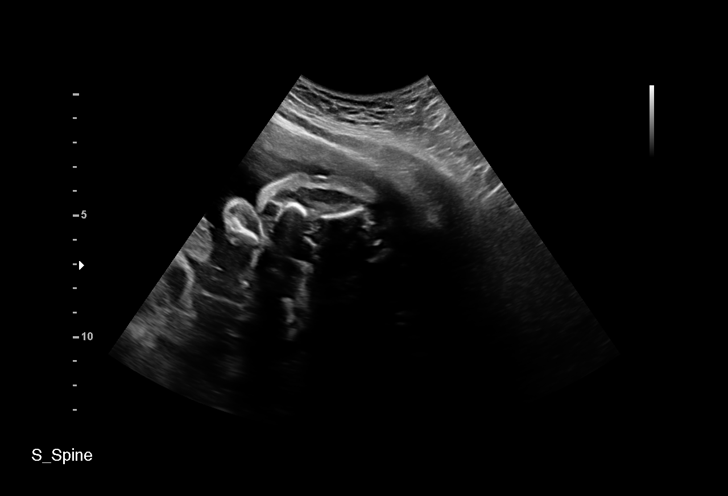
[im 61/61]
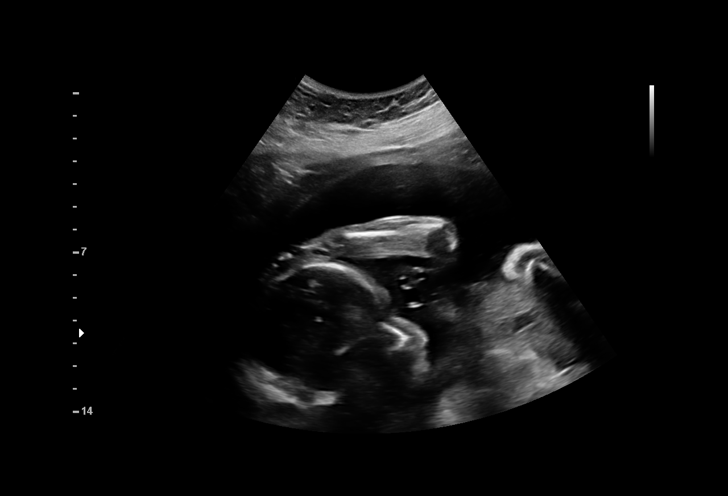

[14 of 28 positions shown; findings below may reference images not displayed]

1  ISAAK ZELADA                [id number]      [id number]     [id number]
Indications

25 weeks gestation of pregnancy
Low lying placenta, antepartum
Encounter for other antenatal screening
follow-up
OB History

Blood Type:            Height:  5'4"   Weight (lb):  183       BMI:
Gravidity:    2         Term:   1        Prem:   0        SAB:   0
TOP:          0       Ectopic:  0        Living: 1
Fetal Evaluation

Num Of Fetuses:     1
Fetal Heart         149
Rate(bpm):
Cardiac Activity:   Observed
Presentation:       Breech
Placenta:           Posterior, above cervical os
P. Cord Insertion:  Previously Visualized

Amniotic Fluid
AFI FV:      Subjectively within normal limits

Largest Pocket(cm)
5.9
Biometry

BPD:      61.8  mm     G. Age:  25w 1d         29  %    CI:        70.78   %    70 - 86
FL/HC:      18.5   %    18.7 -
HC:      234.1  mm     G. Age:  25w 3d         28  %    HC/AC:      1.12        1.04 -
AC:      208.4  mm     G. Age:  25w 3d         41  %    FL/BPD:     69.9   %    71 - 87
FL:       43.2  mm     G. Age:  24w 1d          8  %    FL/AC:      20.7   %    20 - 24
HUM:      41.8  mm     G. Age:  25w 2d         38  %

Est. FW:     751  gm    1 lb 10 oz      42  %
Gestational Age

LMP:           23w 6d        Date:  08/08/17                 EDD:   05/15/18
U/S Today:     25w 0d                                        EDD:   05/07/18
Best:          25w 3d     Det. By:  Early Ultrasound         EDD:   05/04/18
(11/06/17)
Anatomy

Cranium:               Appears normal         Aortic Arch:            Appears normal
Cavum:                 Appears normal         Ductal Arch:            Appears normal
Ventricles:            Appears normal         Diaphragm:              Appears normal
Choroid Plexus:        Previously seen        Stomach:                Appears normal, left
sided
Cerebellum:            Appears normal         Abdomen:                Appears normal
Posterior Fossa:       Appears normal         Abdominal Wall:         Previously seen
Nuchal Fold:           Previously seen        Cord Vessels:           Previously seen
Face:                  Appears normal         Kidneys:                Appear normal
(orbits and profile)
Lips:                  Appears normal         Bladder:                Appears normal
Thoracic:              Appears normal         Spine:                  Appears normal
Heart:                 Appears normal         Upper Extremities:      Previously seen
(4CH, axis, and situs
RVOT:                  Appears normal         Lower Extremities:      Previously seen
LVOT:                  Appears normal

Other:  Male gender. Heels and LT 5th digit previously visualized. Technically
difficult due to fetal position.
Cervix Uterus Adnexa

Cervix
Length:            3.5  cm.
Normal appearance by transabdominal scan.

Uterus
No abnormality visualized.

Left Ovary
Not visualized.

Right Ovary
Not visualized.

Cul De Sac:   No free fluid seen.

Adnexa:       No abnormality visualized.
Impression

Singleton intrauterine pregnancy at 25+3 weeks with low-
lying placenta here for completion of anatomic survey
Interval review of the anatomy shows no sonographic
markers for aneuploidy or structural anomalies
All relevant fetal anatomy has been visualized
the placenta is no longer low-lying
Amniotic fluid volume is normal
Estimated fetal weight shows growth in the 42nd percentile
Recommendations

Follow-up ultrasounds as clinically indicated.
